# Patient Record
Sex: Male | Born: 1960 | Race: Black or African American | Hispanic: No | Marital: Single | Smoking: Current every day smoker
Health system: Southern US, Community
[De-identification: ages and names within clinical notes are randomized; demographics above are authoritative.]

## PROBLEM LIST (undated history)

## (undated) DIAGNOSIS — E119 Type 2 diabetes mellitus without complications: Secondary | ICD-10-CM

## (undated) DIAGNOSIS — I2510 Atherosclerotic heart disease of native coronary artery without angina pectoris: Secondary | ICD-10-CM

## (undated) DIAGNOSIS — I10 Essential (primary) hypertension: Secondary | ICD-10-CM

## (undated) DIAGNOSIS — I5022 Chronic systolic (congestive) heart failure: Secondary | ICD-10-CM

## (undated) HISTORY — PX: OTHER SURGICAL HISTORY: SHX169

---

## 2012-09-24 ENCOUNTER — Emergency Department (HOSPITAL_COMMUNITY)
Admission: EM | Admit: 2012-09-24 | Discharge: 2012-09-24 | Disposition: A | Discharge status: Home / Self Care | Payer: BC Managed Care – PPO | Attending: Emergency Medicine | Admitting: Emergency Medicine

## 2012-09-24 ENCOUNTER — Encounter (HOSPITAL_COMMUNITY): Payer: Self-pay | Admitting: Emergency Medicine

## 2012-09-24 DIAGNOSIS — F17200 Nicotine dependence, unspecified, uncomplicated: Secondary | ICD-10-CM | POA: Insufficient documentation

## 2012-09-24 DIAGNOSIS — M545 Low back pain, unspecified: Secondary | ICD-10-CM | POA: Insufficient documentation

## 2012-09-24 DIAGNOSIS — R0601 Orthopnea: Secondary | ICD-10-CM | POA: Insufficient documentation

## 2012-09-24 DIAGNOSIS — Z79899 Other long term (current) drug therapy: Secondary | ICD-10-CM | POA: Insufficient documentation

## 2012-09-24 DIAGNOSIS — I2510 Atherosclerotic heart disease of native coronary artery without angina pectoris: Secondary | ICD-10-CM | POA: Insufficient documentation

## 2012-09-24 DIAGNOSIS — R0602 Shortness of breath: Secondary | ICD-10-CM | POA: Insufficient documentation

## 2012-09-24 DIAGNOSIS — R350 Frequency of micturition: Secondary | ICD-10-CM

## 2012-09-24 DIAGNOSIS — I10 Essential (primary) hypertension: Secondary | ICD-10-CM | POA: Insufficient documentation

## 2012-09-24 DIAGNOSIS — Z9861 Coronary angioplasty status: Secondary | ICD-10-CM | POA: Insufficient documentation

## 2012-09-24 HISTORY — DX: Essential (primary) hypertension: I10

## 2012-09-24 HISTORY — DX: Atherosclerotic heart disease of native coronary artery without angina pectoris: I25.10

## 2012-09-24 LAB — URINALYSIS, ROUTINE W REFLEX MICROSCOPIC
Glucose, UA: NEGATIVE mg/dL
Hgb urine dipstick: NEGATIVE
Ketones, ur: NEGATIVE mg/dL
pH: 6 (ref 5.0–8.0)

## 2012-09-24 LAB — URINE MICROSCOPIC-ADD ON

## 2012-09-24 MED ORDER — CYCLOBENZAPRINE HCL 10 MG PO TABS: 10.0000 mg | ORAL_TABLET | Freq: Two times a day (BID) | ORAL | Status: DC | PRN

## 2012-09-24 MED ORDER — NAPROXEN 500 MG PO TABS: 500.0000 mg | ORAL_TABLET | Freq: Two times a day (BID) | ORAL | Status: DC

## 2012-09-24 NOTE — ED Notes (Signed)
PT. REPORTS URINARY FREQUENCY AND LOW BACK PAIN FOR SEVERAL DAYS , DENIES FALL OR INJURY , AMBULATORY , NO FEVER OR CHILLS . HYPERTENSIVE AT TRIAGE .

## 2012-09-24 NOTE — ED Provider Notes (Signed)
CSN: [id number]     Arrival date & time 09/24/12  1804 History    This chart was scribed for Felicie Morn, NP working with Lyanne Co, MD by Quintella Reichert, ED Scribe. This patient was seen in room TR11C/TR11C and the patient's care was started at 7:47 PM.     Chief Complaint  Patient presents with  . Urinary Frequency  . Back Pain    Patient is a 52 y.o. male presenting with frequency and back pain. The history is provided by the patient. No language interpreter was used.  Urinary Frequency This is a new problem. The current episode started more than 1 week ago. The problem occurs constantly. The problem has been gradually worsening. Pertinent negatives include no chest pain, no abdominal pain, no headaches and no shortness of breath. Nothing aggravates the symptoms. Nothing relieves the symptoms. He has tried nothing for the symptoms.  Back Pain Location:  Lumbar spine Radiates to:  Does not radiate Pain severity:  Moderate Duration:  1 week Timing:  Constant Progression:  Worsening Chronicity:  New Context: lifting heavy objects (does so regularly at work but denies any recent changes)   Context: not falling, not MVA and not recent injury   Relieved by:  Nothing Worsened by:  Twisting Ineffective treatments:  None tried Associated symptoms: no abdominal pain, no chest pain, no dysuria and no headaches     HPI Comments: Devon [name] is a 52 y.o. male with h/o CAD and HTN who presents to the Emergency Department complaining of 2 weeks of progressively-worsening lower back pain with accompanying urinary frequency.  Pt notes that he lifts regularly at work but denies any recent changes, unusual activities or injuries that may have brought on pain.  Pain is localized to both sides of the lower back and he denies midline back pain.  It is exacerbated by twisting his torso.  He states that he is urinating more frequently in small amounts and has been waking up at night to urinate.   He denies dysuria, hematuria or discharge.  He denies h/o DM to his knowledge and has not had his prostate checked.  Pt also complains of some positional SOB when he lies flat at night and states he has to sleep propped up with 2 pillows but still feels slightly SOB.  He denies SOB at other times and denies leg or ankle swelling.  He does not take any medications regularly.  He denies medication allergies.  Pt has no PCP   Past Medical History  Diagnosis Date  . Coronary artery disease   . Hypertension     Past Surgical History  Procedure Laterality Date  . Coronary stents      No family history on file.   History  Substance Use Topics  . Smoking status: Current Every Day Smoker  . Smokeless tobacco: Not on file  . Alcohol Use: Yes     Review of Systems  Respiratory: Negative for shortness of breath.   Cardiovascular: Negative for chest pain and leg swelling.       Orthopnea  Gastrointestinal: Negative for abdominal pain.  Genitourinary: Positive for frequency. Negative for dysuria, hematuria and discharge.  Musculoskeletal: Positive for back pain.  Neurological: Negative for headaches.  All other systems reviewed and are negative.      Allergies  Review of patient's allergies indicates no known allergies.  Home Medications   Current Outpatient Rx  Name  Route  Sig  Dispense  Refill  .  aspirin 325 MG tablet   Oral   Take 325 mg by mouth daily.         Marland Kitchen ibuprofen (ADVIL,MOTRIN) 200 MG tablet   Oral   Take 600 mg by mouth every 6 (six) hours as needed for pain.          Temp(Src) 99.2 F (37.3 C) (Oral)  Physical Exam  Nursing note and vitals reviewed. Constitutional: He is oriented to person, place, and time. He appears well-developed and well-nourished. No distress.  HENT:  Head: Normocephalic and atraumatic.  Eyes: EOM are normal.  Neck: Neck supple. No tracheal deviation present.  Cardiovascular: Normal rate, regular rhythm and normal heart  sounds.   No murmur heard. Pulmonary/Chest: Effort normal and breath sounds normal. No respiratory distress. He has no wheezes. He has no rales.  Musculoskeletal: Normal range of motion.       Lumbar back: He exhibits spasm.  Spasmodic pain to lumbar paraspinal muscles  Neurological: He is alert and oriented to person, place, and time.  Skin: Skin is warm and dry.  Psychiatric: He has a normal mood and affect. His behavior is normal.    ED Course  Procedures (including critical care time)   COORDINATION OF CARE: 7:54 PM-Discussed treatment plan which includes UA, CBG and f/u with PCP with pt at bedside and pt agreed to plan.     Results for orders placed during the hospital encounter of 09/24/12  URINALYSIS, ROUTINE W REFLEX MICROSCOPIC      Result Value Range   Color, Urine YELLOW  YELLOW   APPearance CLEAR  CLEAR   Specific Gravity, Urine 1.013  1.005 - 1.030   pH 6.0  5.0 - 8.0   Glucose, UA NEGATIVE  NEGATIVE mg/dL   Hgb urine dipstick NEGATIVE  NEGATIVE   Bilirubin Urine NEGATIVE  NEGATIVE   Ketones, ur NEGATIVE  NEGATIVE mg/dL   Protein, ur 119 (*) NEGATIVE mg/dL   Urobilinogen, UA 1.0  0.0 - 1.0 mg/dL   Nitrite NEGATIVE  NEGATIVE   Leukocytes, UA NEGATIVE  NEGATIVE  URINE MICROSCOPIC-ADD ON      Result Value Range   Squamous Epithelial / LPF RARE  RARE   WBC, UA 0-2  <3 WBC/hpf   RBC / HPF 0-2  <3 RBC/hpf  GLUCOSE, CAPILLARY      Result Value Range   Glucose-Capillary 107 (*) 70 - 99 mg/dL     No diagnosis found.   MDM  No indication of UTI.  No diabetic history, random blood glucose 7 hours post-prandial normal.  Suspect musculoskeletal back pain.  No worrisome neurologic findings (normal exam).      I personally performed the services described in this documentation, which was scribed in my presence. The recorded information has been reviewed and is accurate.   Jimmye Norman, NP 09/24/12 [phone]

## 2012-09-27 NOTE — ED Provider Notes (Signed)
Medical screening examination/treatment/procedure(s) were performed by non-physician practitioner and as supervising physician I was immediately available for consultation/collaboration.   Kyen Taite M Mouna Yager, MD 09/27/12 1428 

## 2012-10-26 ENCOUNTER — Encounter (HOSPITAL_COMMUNITY): Payer: Self-pay | Admitting: Emergency Medicine

## 2012-10-26 ENCOUNTER — Other Ambulatory Visit: Payer: Self-pay

## 2012-10-26 ENCOUNTER — Emergency Department (HOSPITAL_COMMUNITY)
Admission: EM | Admit: 2012-10-26 | Discharge: 2012-10-26 | Disposition: A | Discharge status: Home / Self Care | Payer: BC Managed Care – PPO | Attending: Emergency Medicine | Admitting: Emergency Medicine

## 2012-10-26 ENCOUNTER — Emergency Department (HOSPITAL_COMMUNITY): Payer: BC Managed Care – PPO

## 2012-10-26 DIAGNOSIS — Z79899 Other long term (current) drug therapy: Secondary | ICD-10-CM | POA: Insufficient documentation

## 2012-10-26 DIAGNOSIS — I10 Essential (primary) hypertension: Secondary | ICD-10-CM | POA: Insufficient documentation

## 2012-10-26 DIAGNOSIS — E119 Type 2 diabetes mellitus without complications: Secondary | ICD-10-CM | POA: Insufficient documentation

## 2012-10-26 DIAGNOSIS — J189 Pneumonia, unspecified organism: Secondary | ICD-10-CM

## 2012-10-26 DIAGNOSIS — J159 Unspecified bacterial pneumonia: Secondary | ICD-10-CM | POA: Insufficient documentation

## 2012-10-26 DIAGNOSIS — I2510 Atherosclerotic heart disease of native coronary artery without angina pectoris: Secondary | ICD-10-CM | POA: Insufficient documentation

## 2012-10-26 DIAGNOSIS — R5381 Other malaise: Secondary | ICD-10-CM | POA: Insufficient documentation

## 2012-10-26 DIAGNOSIS — F17200 Nicotine dependence, unspecified, uncomplicated: Secondary | ICD-10-CM | POA: Insufficient documentation

## 2012-10-26 DIAGNOSIS — Z9861 Coronary angioplasty status: Secondary | ICD-10-CM | POA: Insufficient documentation

## 2012-10-26 DIAGNOSIS — R110 Nausea: Secondary | ICD-10-CM | POA: Insufficient documentation

## 2012-10-26 DIAGNOSIS — I509 Heart failure, unspecified: Secondary | ICD-10-CM | POA: Insufficient documentation

## 2012-10-26 DIAGNOSIS — Z7982 Long term (current) use of aspirin: Secondary | ICD-10-CM | POA: Insufficient documentation

## 2012-10-26 HISTORY — DX: Type 2 diabetes mellitus without complications: E11.9

## 2012-10-26 LAB — CBC
HCT: 38.4 % — ABNORMAL LOW (ref 39.0–52.0)
MCH: 28.1 pg (ref 26.0–34.0)
MCV: 80.5 fL (ref 78.0–100.0)
Platelets: 254 10*3/uL (ref 150–400)
RBC: 4.77 MIL/uL (ref 4.22–5.81)

## 2012-10-26 LAB — BASIC METABOLIC PANEL
BUN: 18 mg/dL (ref 6–23)
CO2: 24 mEq/L (ref 19–32)
Calcium: 9.1 mg/dL (ref 8.4–10.5)
Creatinine, Ser: 1.34 mg/dL (ref 0.50–1.35)
Glucose, Bld: 199 mg/dL — ABNORMAL HIGH (ref 70–99)

## 2012-10-26 LAB — PRO B NATRIURETIC PEPTIDE: Pro B Natriuretic peptide (BNP): 6306 pg/mL — ABNORMAL HIGH (ref 0–125)

## 2012-10-26 LAB — TROPONIN I: Troponin I: <0.3 ng/mL (ref <0.30)

## 2012-10-26 MED ORDER — SODIUM CHLORIDE 0.9 % IV BOLUS (SEPSIS)
250.0000 mL | Freq: Once | INTRAVENOUS | Status: AC
  Administered 2012-10-26: 21:00:00 via INTRAVENOUS

## 2012-10-26 MED ORDER — ALBUTEROL SULFATE HFA 108 (90 BASE) MCG/ACT IN AERS
2.0000 | INHALATION_SPRAY | Freq: Four times a day (QID) | RESPIRATORY_TRACT | Status: DC
  Administered 2012-10-26: 2 via RESPIRATORY_TRACT
  Filled 2012-10-26: qty 6.7

## 2012-10-26 MED ORDER — DM-GUAIFENESIN ER 30-600 MG PO TB12: 1.0000 | ORAL_TABLET | Freq: Two times a day (BID) | ORAL | Status: DC

## 2012-10-26 MED ORDER — LEVOFLOXACIN 500 MG PO TABS: 750.0000 mg | ORAL_TABLET | Freq: Every day | ORAL | Status: DC

## 2012-10-26 MED ORDER — AZITHROMYCIN 250 MG PO TABS
500.0000 mg | ORAL_TABLET | Freq: Once | ORAL | Status: AC
  Administered 2012-10-26: 500 mg via ORAL
  Filled 2012-10-26: qty 2

## 2012-10-26 MED ORDER — DEXTROSE 5 % IV SOLN
1.0000 g | Freq: Once | INTRAVENOUS | Status: AC
  Administered 2012-10-26: 1 g via INTRAVENOUS
  Filled 2012-10-26: qty 10

## 2012-10-26 MED ORDER — SODIUM CHLORIDE 0.9 % IV SOLN
INTRAVENOUS | Status: DC
  Administered 2012-10-26: 100 mL/h via INTRAVENOUS

## 2012-10-26 NOTE — ED Notes (Signed)
Pt. reports progressing SOB / exertional dyspnea for several weeks with occasional productive cough , pt. stated history of CHF and cardiac stent . Denies chest pain .

## 2012-10-26 NOTE — ED Notes (Addendum)
Pt initially declined cxr, plan process and wait explained. Pt & wife encouraged. Pt to xray by w/c. Pt & wife have expectations based on what they were told at urgent care earlier. Pt alert, NAD, calm, frustrated.

## 2012-10-26 NOTE — ED Notes (Signed)
Pt reports shortness of breath started several weeks ago. Was at urgent care today and sent here for further evaluation. Pt has labored breathing but is maintaining 100 SpO2 on room air. Pt respiration rate is 28. Pt reports 10lb weight gain in the past month. Pt received breathing tx while at urgent care, but reports no improvement.

## 2012-10-26 NOTE — ED Notes (Addendum)
Pt is a pt of Dr. Littie Deeds.  Dr. Jacinto Halim on call & returned page. Not familiar with this pt. Triage protocols initiated & explained. Dr. Jacinto Halim stated, "Call if found to be cardiac". no further orders. Pending lab & imaging results.

## 2012-10-26 NOTE — ED Provider Notes (Signed)
He CSN: [id number]     Arrival date & time 10/26/12  1902 History   First MD Initiated Contact with Patient 10/26/12 2012     Chief Complaint  Patient presents with  . Shortness of Breath   (Consider location/radiation/quality/duration/timing/severity/associated sxs/prior Treatment) The history is provided by the patient and the spouse.   52 year old male past history of coronary artery disease currently not followed. Recent history of hypertension. And diabetes. Patient with productive cough for a week increasing shortness of breath today with exertion. Chest pain only with cough no constant pain in the chest. Denies fevers. Some nausea no vomiting no diarrhea. Patient seen in urgent care and there was concern for congestive heart failure no chest x-ray done there and referred here.  Past Medical History  Diagnosis Date  . Coronary artery disease   . Hypertension   . CHF (congestive heart failure)   . Diabetes mellitus without complication    Past Surgical History  Procedure Laterality Date  . Coronary stents     No family history on file. History  Substance Use Topics  . Smoking status: Current Every Day Smoker  . Smokeless tobacco: Not on file  . Alcohol Use: Yes    Review of Systems  Constitutional: Positive for fatigue. Negative for fever and chills.  HENT: Positive for congestion.   Eyes: Negative for visual disturbance.  Respiratory: Positive for cough and shortness of breath.   Cardiovascular: Positive for chest pain.  Gastrointestinal: Positive for nausea. Negative for vomiting and abdominal pain.  Genitourinary: Negative for dysuria.  Musculoskeletal: Negative for back pain.  Skin: Negative for rash.  Neurological: Negative for headaches.  Hematological: Does not bruise/bleed easily.  Psychiatric/Behavioral: Negative for confusion.    Allergies  Review of patient's allergies indicates no known allergies.  Home Medications   Current Outpatient Rx  Name   Route  Sig  Dispense  Refill  . aspirin EC 81 MG tablet   Oral   Take 81 mg by mouth daily.         . cyclobenzaprine (FLEXERIL) 10 MG tablet   Oral   Take 1 tablet (10 mg total) by mouth 2 (two) times daily as needed for muscle spasms.   20 tablet   0   . dextromethorphan-guaiFENesin (MUCINEX DM) 30-600 MG per 12 hr tablet   Oral   Take 1 tablet by mouth every 12 (twelve) hours.   14 tablet   0   . levofloxacin (LEVAQUIN) 500 MG tablet   Oral   Take 1.5 tablets (750 mg total) by mouth daily.   5 tablet   0   . naproxen (NAPROSYN) 500 MG tablet   Oral   Take 1 tablet (500 mg total) by mouth 2 (two) times daily.   20 tablet   0    BP 143/98  Pulse 91  Temp(Src) 98 F (36.7 C) (Oral)  Resp 22  Ht 5\' 10"  (1.778 m)  Wt 255 lb (115.667 kg)  BMI 36.59 kg/m2  SpO2 92% Physical Exam  Nursing note and vitals reviewed. Constitutional: He is oriented to person, place, and time. He appears well-developed and well-nourished. No distress.  HENT:  Head: Normocephalic and atraumatic.  Mouth/Throat: Oropharynx is clear and moist.  Eyes: Conjunctivae and EOM are normal. Pupils are equal, round, and reactive to light.  Neck: Normal range of motion.  Cardiovascular: Normal rate, regular rhythm and normal heart sounds.   No murmur heard. Pulmonary/Chest: Effort normal and breath sounds normal. No  respiratory distress. He has no wheezes. He has no rales.  Abdominal: Soft. Bowel sounds are normal. There is no tenderness.  Musculoskeletal: Normal range of motion.  Neurological: He is alert and oriented to person, place, and time. No cranial nerve deficit. He exhibits normal muscle tone. Coordination normal.  Skin: Skin is warm. No rash noted. No erythema.    ED Course  Procedures (including critical care time) Labs Review Labs Reviewed  CBC - Abnormal; Notable for the following:    HCT 38.4 (*)    RDW 16.6 (*)    All other components within normal limits  BASIC METABOLIC  PANEL - Abnormal; Notable for the following:    Glucose, Bld 199 (*)    GFR calc non Af Amer 59 (*)    GFR calc Af Amer 69 (*)    All other components within normal limits  PRO B NATRIURETIC PEPTIDE - Abnormal; Notable for the following:    Pro B Natriuretic peptide (BNP) 6306.0 (*)    All other components within normal limits  TROPONIN I  POCT I-STAT TROPONIN I   Results for orders placed during the hospital encounter of 10/26/12  CBC      Result Value Range   WBC 9.6  4.0 - 10.5 K/uL   RBC 4.77  4.22 - 5.81 MIL/uL   Hemoglobin 13.4  13.0 - 17.0 g/dL   HCT 14.7 (*) 82.9 - 56.2 %   MCV 80.5  78.0 - 100.0 fL   MCH 28.1  26.0 - 34.0 pg   MCHC 34.9  30.0 - 36.0 g/dL   RDW 13.0 (*) 86.5 - 78.4 %   Platelets 254  150 - 400 K/uL  BASIC METABOLIC PANEL      Result Value Range   Sodium 135  135 - 145 mEq/L   Potassium 3.9  3.5 - 5.1 mEq/L   Chloride 99  96 - 112 mEq/L   CO2 24  19 - 32 mEq/L   Glucose, Bld 199 (*) 70 - 99 mg/dL   BUN 18  6 - 23 mg/dL   Creatinine, Ser 6.96  0.50 - 1.35 mg/dL   Calcium 9.1  8.4 - 29.5 mg/dL   GFR calc non Af Amer 59 (*) >90 mL/min   GFR calc Af Amer 69 (*) >90 mL/min  PRO B NATRIURETIC PEPTIDE      Result Value Range   Pro B Natriuretic peptide (BNP) 6306.0 (*) 0 - 125 pg/mL  TROPONIN I      Result Value Range   Troponin I <0.30  <0.30 ng/mL  POCT I-STAT TROPONIN I      Result Value Range   Troponin i, poc 0.04  0.00 - 0.08 ng/mL   Comment 3                Date: 10/26/2012  Rate: 92  Rhythm: normal sinus rhythm  QRS Axis: normal  Intervals: normal  ST/T Wave abnormalities: nonspecific ST changes  Conduction Disutrbances:none  Narrative Interpretation:   Old EKG Reviewed: none available Possible anterior lateral infarct age undetermined.   Imaging Review Dg Chest 2 View  10/26/2012   CLINICAL DATA:  Abnormal chest sounds. Cough.  EXAM: CHEST  2 VIEW  COMPARISON:  No priors.  FINDINGS: Diffuse peribronchial cuffing and mild  interstitial prominence throughout the lungs bilaterally. No consolidative airspace disease. Possible trace bilateral pleural effusions. No evidence of pulmonary edema. Heart size is mildly enlarged. Mediastinal contours are otherwise unremarkable.  IMPRESSION: 1.  The appearance of the chest suggests either severe bronchitis or early multilobar bronchopneumonia. 2. Trace bilateral pleural effusions are suspected. 3. Mild cardiomegaly.   Electronically Signed   By: Trudie Reed M.D.   On: 10/26/2012 20:16    MDM   1. Community acquired pneumonia   2. Hypertension    Patient referred from urgent care for concern for congestive heart failure. Patient did not have any cardiac-like chest pain and soreness in the chest from coughing for about a week. Chest x-ray is consistent with sniffing bronchitis or early mold Tylenol or pneumonia. Patient nontoxic in the ED patient's room air sats always been above 90% we walked him as well here prior to discharge and his sats were in the upper 90s. Patient subjectively feel short of breath. Afebrile no significant leukocytosis no anemia no sniffing and the backside abnormalities. Patient recently noted to have a borderline diabetes early diabetes. Also noted to be hypertensive and was noted at her previous visit in urgent care there in the process of a evaluating him and will be started on hypertensive meds by them and followup. Patient's had coronary stents in the past but has not followed by cardiology here. As stated x-ray consistent more with a community acquired pneumonia no recent admissions treated with Rocephin and Zithromax by mouth cardiac workup was negative to include 2 troponins. EKG without acute changes.  Patient will be discharged home on Levaquin due to the comorbidities. Referral to cardiology provided to continue followup with urgent care. Patient knows to return if he gets worse at all or not improved in one to 2 days. Patient also given albuterol  inhaler and Mucinex DM to take at home.    Shelda Jakes, MD 10/26/12 2245

## 2012-10-26 NOTE — ED Notes (Signed)
Pt ambulated in hallways. Upon arising from bed SpO2 89/RA when walking SpO2 94-98%/RA. Pt needed to stop- sts feeling SOB and winded- SpO2 97%. Dr. Deretha Emory made aware.

## 2012-10-28 ENCOUNTER — Emergency Department (HOSPITAL_COMMUNITY): Payer: BC Managed Care – PPO

## 2012-10-28 ENCOUNTER — Inpatient Hospital Stay (HOSPITAL_COMMUNITY)
Admission: EM | Admit: 2012-10-28 | Discharge: 2012-11-05 | DRG: 550 | Disposition: A | Discharge status: Home / Self Care | Payer: BC Managed Care – PPO | Attending: Internal Medicine | Admitting: Internal Medicine

## 2012-10-28 ENCOUNTER — Encounter (HOSPITAL_COMMUNITY): Payer: Self-pay | Admitting: *Deleted

## 2012-10-28 DIAGNOSIS — I509 Heart failure, unspecified: Secondary | ICD-10-CM

## 2012-10-28 DIAGNOSIS — I420 Dilated cardiomyopathy: Secondary | ICD-10-CM

## 2012-10-28 DIAGNOSIS — Z9861 Coronary angioplasty status: Secondary | ICD-10-CM

## 2012-10-28 DIAGNOSIS — Y849 Medical procedure, unspecified as the cause of abnormal reaction of the patient, or of later complication, without mention of misadventure at the time of the procedure: Secondary | ICD-10-CM | POA: Diagnosis present

## 2012-10-28 DIAGNOSIS — R918 Other nonspecific abnormal finding of lung field: Secondary | ICD-10-CM | POA: Diagnosis present

## 2012-10-28 DIAGNOSIS — I252 Old myocardial infarction: Secondary | ICD-10-CM | POA: Diagnosis not present

## 2012-10-28 DIAGNOSIS — I2789 Other specified pulmonary heart diseases: Secondary | ICD-10-CM | POA: Diagnosis present

## 2012-10-28 DIAGNOSIS — J9600 Acute respiratory failure, unspecified whether with hypoxia or hypercapnia: Secondary | ICD-10-CM | POA: Diagnosis present

## 2012-10-28 DIAGNOSIS — J209 Acute bronchitis, unspecified: Secondary | ICD-10-CM | POA: Diagnosis present

## 2012-10-28 DIAGNOSIS — I10 Essential (primary) hypertension: Secondary | ICD-10-CM | POA: Diagnosis present

## 2012-10-28 DIAGNOSIS — I5023 Acute on chronic systolic (congestive) heart failure: Principal | ICD-10-CM

## 2012-10-28 DIAGNOSIS — K7689 Other specified diseases of liver: Secondary | ICD-10-CM | POA: Diagnosis present

## 2012-10-28 DIAGNOSIS — I214 Non-ST elevation (NSTEMI) myocardial infarction: Secondary | ICD-10-CM

## 2012-10-28 DIAGNOSIS — R05 Cough: Secondary | ICD-10-CM | POA: Diagnosis present

## 2012-10-28 DIAGNOSIS — E119 Type 2 diabetes mellitus without complications: Secondary | ICD-10-CM | POA: Diagnosis present

## 2012-10-28 DIAGNOSIS — F17200 Nicotine dependence, unspecified, uncomplicated: Secondary | ICD-10-CM | POA: Diagnosis present

## 2012-10-28 DIAGNOSIS — Z720 Tobacco use: Secondary | ICD-10-CM | POA: Diagnosis not present

## 2012-10-28 DIAGNOSIS — E039 Hypothyroidism, unspecified: Secondary | ICD-10-CM | POA: Diagnosis present

## 2012-10-28 DIAGNOSIS — R0902 Hypoxemia: Secondary | ICD-10-CM | POA: Diagnosis present

## 2012-10-28 DIAGNOSIS — I2510 Atherosclerotic heart disease of native coronary artery without angina pectoris: Secondary | ICD-10-CM

## 2012-10-28 DIAGNOSIS — R7989 Other specified abnormal findings of blood chemistry: Secondary | ICD-10-CM

## 2012-10-28 DIAGNOSIS — I5021 Acute systolic (congestive) heart failure: Secondary | ICD-10-CM

## 2012-10-28 DIAGNOSIS — I50810 Right heart failure, unspecified: Secondary | ICD-10-CM

## 2012-10-28 DIAGNOSIS — T82897A Other specified complication of cardiac prosthetic devices, implants and grafts, initial encounter: Secondary | ICD-10-CM | POA: Diagnosis present

## 2012-10-28 DIAGNOSIS — J180 Bronchopneumonia, unspecified organism: Secondary | ICD-10-CM | POA: Diagnosis present

## 2012-10-28 DIAGNOSIS — G4733 Obstructive sleep apnea (adult) (pediatric): Secondary | ICD-10-CM | POA: Diagnosis present

## 2012-10-28 DIAGNOSIS — R058 Other specified cough: Secondary | ICD-10-CM | POA: Diagnosis present

## 2012-10-28 DIAGNOSIS — D638 Anemia in other chronic diseases classified elsewhere: Secondary | ICD-10-CM | POA: Diagnosis present

## 2012-10-28 DIAGNOSIS — J40 Bronchitis, not specified as acute or chronic: Secondary | ICD-10-CM

## 2012-10-28 DIAGNOSIS — Z7982 Long term (current) use of aspirin: Secondary | ICD-10-CM

## 2012-10-28 DIAGNOSIS — Z6833 Body mass index (BMI) 33.0-33.9, adult: Secondary | ICD-10-CM

## 2012-10-28 DIAGNOSIS — I428 Other cardiomyopathies: Secondary | ICD-10-CM | POA: Diagnosis present

## 2012-10-28 DIAGNOSIS — F411 Generalized anxiety disorder: Secondary | ICD-10-CM | POA: Diagnosis not present

## 2012-10-28 DIAGNOSIS — E669 Obesity, unspecified: Secondary | ICD-10-CM | POA: Diagnosis present

## 2012-10-28 DIAGNOSIS — E038 Other specified hypothyroidism: Secondary | ICD-10-CM | POA: Diagnosis present

## 2012-10-28 DIAGNOSIS — N179 Acute kidney failure, unspecified: Secondary | ICD-10-CM | POA: Diagnosis not present

## 2012-10-28 DIAGNOSIS — R0602 Shortness of breath: Secondary | ICD-10-CM | POA: Diagnosis present

## 2012-10-28 LAB — LIPASE, BLOOD: Lipase: 37 U/L (ref 11–59)

## 2012-10-28 LAB — CBC
HCT: 37.8 % — ABNORMAL LOW (ref 39.0–52.0)
Hemoglobin: 13.6 g/dL (ref 13.0–17.0)
MCH: 28.9 pg (ref 26.0–34.0)
MCV: 80.3 fL (ref 78.0–100.0)
Platelets: 257 10*3/uL (ref 150–400)
RBC: 4.71 MIL/uL (ref 4.22–5.81)
WBC: 9.3 10*3/uL (ref 4.0–10.5)

## 2012-10-28 LAB — HEPATIC FUNCTION PANEL
ALT: 289 U/L — ABNORMAL HIGH (ref 0–53)
Alkaline Phosphatase: 89 U/L (ref 39–117)
Indirect Bilirubin: 2.4 mg/dL — ABNORMAL HIGH (ref 0.3–0.9)
Total Bilirubin: 3.2 mg/dL — ABNORMAL HIGH (ref 0.3–1.2)

## 2012-10-28 LAB — BASIC METABOLIC PANEL
BUN: 22 mg/dL (ref 6–23)
CO2: 23 mEq/L (ref 19–32)
Calcium: 8.8 mg/dL (ref 8.4–10.5)
Chloride: 95 mEq/L — ABNORMAL LOW (ref 96–112)
Creatinine, Ser: 1.14 mg/dL (ref 0.50–1.35)
Glucose, Bld: 151 mg/dL — ABNORMAL HIGH (ref 70–99)

## 2012-10-28 LAB — GAMMA GT: GGT: 141 U/L — ABNORMAL HIGH (ref 7–51)

## 2012-10-28 LAB — MAGNESIUM: Magnesium: 2.1 mg/dL (ref 1.5–2.5)

## 2012-10-28 LAB — D-DIMER, QUANTITATIVE: D-Dimer, Quant: 2.36 ug/mL-FEU — ABNORMAL HIGH (ref 0.00–0.48)

## 2012-10-28 LAB — POCT I-STAT TROPONIN I: Troponin i, poc: 0.03 ng/mL (ref 0.00–0.08)

## 2012-10-28 LAB — GLUCOSE, CAPILLARY: Glucose-Capillary: 98 mg/dL (ref 70–99)

## 2012-10-28 MED ORDER — FUROSEMIDE 10 MG/ML IJ SOLN
40.0000 mg | Freq: Once | INTRAMUSCULAR | Status: AC
  Administered 2012-10-28: 40 mg via INTRAVENOUS
  Filled 2012-10-28: qty 4

## 2012-10-28 MED ORDER — SODIUM CHLORIDE 0.9 % IJ SOLN
3.0000 mL | Freq: Two times a day (BID) | INTRAMUSCULAR | Status: DC
  Administered 2012-10-28 – 2012-11-02 (×10): 3 mL via INTRAVENOUS

## 2012-10-28 MED ORDER — DOXYCYCLINE HYCLATE 100 MG PO TABS
100.0000 mg | ORAL_TABLET | Freq: Two times a day (BID) | ORAL | Status: DC
  Administered 2012-10-29 – 2012-11-02 (×11): 100 mg via ORAL
  Filled 2012-10-28 (×14): qty 1

## 2012-10-28 MED ORDER — IPRATROPIUM BROMIDE 0.02 % IN SOLN
0.5000 mg | RESPIRATORY_TRACT | Status: DC | PRN
  Filled 2012-10-28: qty 2.5

## 2012-10-28 MED ORDER — ENOXAPARIN SODIUM 40 MG/0.4ML ~~LOC~~ SOLN
40.0000 mg | SUBCUTANEOUS | Status: DC
  Administered 2012-10-28 – 2012-11-02 (×6): 40 mg via SUBCUTANEOUS
  Filled 2012-10-28 (×7): qty 0.4

## 2012-10-28 MED ORDER — ALBUTEROL SULFATE (5 MG/ML) 0.5% IN NEBU
2.5000 mg | INHALATION_SOLUTION | RESPIRATORY_TRACT | Status: DC | PRN
  Administered 2012-10-29 (×2): 2.5 mg via RESPIRATORY_TRACT
  Filled 2012-10-28 (×3): qty 0.5

## 2012-10-28 MED ORDER — IOHEXOL 350 MG/ML SOLN
100.0000 mL | Freq: Once | INTRAVENOUS | Status: AC | PRN
  Administered 2012-10-28: 100 mL via INTRAVENOUS

## 2012-10-28 MED ORDER — HYDROCOD POLST-CHLORPHEN POLST 10-8 MG/5ML PO LQCR
5.0000 mL | Freq: Two times a day (BID) | ORAL | Status: DC | PRN
  Administered 2012-10-29 – 2012-11-02 (×7): 5 mL via ORAL
  Filled 2012-10-28 (×9): qty 5

## 2012-10-28 MED ORDER — LORAZEPAM 2 MG/ML IJ SOLN
1.0000 mg | Freq: Once | INTRAMUSCULAR | Status: AC
  Administered 2012-10-28: 1 mg via INTRAVENOUS
  Filled 2012-10-28: qty 1

## 2012-10-28 MED ORDER — LISINOPRIL 5 MG PO TABS
5.0000 mg | ORAL_TABLET | Freq: Every day | ORAL | Status: DC
  Administered 2012-10-29 – 2012-11-01 (×3): 5 mg via ORAL
  Filled 2012-10-28 (×5): qty 1

## 2012-10-28 MED ORDER — DIPHENHYDRAMINE HCL 25 MG PO CAPS
25.0000 mg | ORAL_CAPSULE | Freq: Every evening | ORAL | Status: DC | PRN
  Administered 2012-10-30: 25 mg via ORAL
  Filled 2012-10-28: qty 1

## 2012-10-28 MED ORDER — ASPIRIN EC 81 MG PO TBEC
81.0000 mg | DELAYED_RELEASE_TABLET | Freq: Every day | ORAL | Status: DC
  Administered 2012-10-28 – 2012-11-05 (×8): 81 mg via ORAL
  Filled 2012-10-28 (×9): qty 1

## 2012-10-28 MED ORDER — INSULIN ASPART 100 UNIT/ML ~~LOC~~ SOLN
0.0000 [IU] | Freq: Three times a day (TID) | SUBCUTANEOUS | Status: DC
  Administered 2012-10-29: 12:00:00 2 [IU] via SUBCUTANEOUS

## 2012-10-28 MED ORDER — DEXTROSE 5 % IV SOLN
1.0000 g | INTRAVENOUS | Status: DC
  Administered 2012-10-29: 1 g via INTRAVENOUS
  Filled 2012-10-28 (×2): qty 10

## 2012-10-28 MED ORDER — AZITHROMYCIN 500 MG PO TABS: 500.0000 mg | ORAL_TABLET | Freq: Every day | ORAL | Status: DC

## 2012-10-28 NOTE — H&P (Signed)
Date: 10/28/2012               Patient Name:  Devon [name] MRN: [id number]  DOB: May 08, 1960 Age / Sex: 52 y.o., male   PCP: No Pcp Per Patient         Medical Service: Internal Medicine Teaching Service         Attending Physician: Dr. Burns Spain, MD    First Contact: Dr. Darci Needle  Pager: [phone]  Second Contact: Dr. Sherrine Maples Pager: [phone]       After Hours (After 5p/  First Contact Pager: [phone]  weekends / holidays): Second Contact Pager: [phone]   Chief Complaint: shortness of breath   History of Present Illness:  Devon [name] is a 52 year old man with history of DM2 (diagnosed at urgent care this week), HTN (diagnosed at urgent care this week), CAD (per pt MI in [place] in 2004 with cath and stent x 2 placed at that time) who presents for shortness of breath and nonproductive cough x 3-4 weeks. He sought care at [facility] last Thursday 9/11 and then again on Monday 9/15 at which time he was diagnosed with pneumonia and sent to the ED.  Pt was started on Levaquin and Mucinex and reports good medication compliance, but his symptoms have not improved since that time so he returned to ED today.  He states that he has not been able to sleep due to severe cough, he also gets some left sided abdominal pain when he coughs hard.  No runny nose, sore throat, ear pain, n/v/d. No sick contacts, no recent travel, no recent hospitalizations He endorses orthopnea but no chest pain, palpitations, PND, lower extremity swelling.  Of note, pt quit smoking one week ago, prior to that he had smoked two packs per week for many years.     Acute abdomen series in ED showed "persistent diffuse interstitial pattern compatible with severe bronchitis or early bronchopneumonia," small bilateral pleural effusions, and normal appearance of abdomen.  CTA chest showed no evidence of pulmonary embolus but "degree of righy sided heart failure" as well as two nodular opacities in left lower lobe with  recommendation for repeat chest CT in 3-6 months.  BNP elevated at 4543 but decreased from 6306 on 9/15.  Troponin x1 negative.  Pt received dose of Lasix in ED given elevated BNP. He was satting 99% at rest but desatted to 80s-90% with ambulation thus was admitted for further evaluation.  He was placed on supplemental oxygen and went back into upper 90s.    Prior to this admission, pt did not take any medications other than occasional aspirin.  DM2 and HTN were diagnosed at urgent care on 9/15.  FH of HTN (father, brother, sister), DM2 (sister) but not CAD.   Meds: Current Facility-Administered Medications  Medication Dose Route Frequency Provider Last Rate Last Dose  . ipratropium (ATROVENT) nebulizer solution 0.5 mg  0.5 mg Nebulization Q4H PRN Annett Gula, MD       And  . albuterol (PROVENTIL) (5 MG/ML) 0.5% nebulizer solution 2.5 mg  2.5 mg Nebulization Q4H PRN Annett Gula, MD      . aspirin EC tablet 81 mg  81 mg Oral Daily Annett Gula, MD   81 mg at 10/28/12 2127  . cefTRIAXone (ROCEPHIN) 1 g in dextrose 5 % 50 mL IVPB  1 g Intravenous Q24H [facility] [place], [place]      . chlorpheniramine-HYDROcodone (TUSSIONEX) 10-8 MG/5ML suspension  5 mL  5 mL Oral Q12H PRN Annett Gula, MD      . diphenhydrAMINE (BENADRYL) capsule 25 mg  25 mg Oral QHS PRN Annett Gula, MD      . doxycycline (VIBRA-TABS) tablet 100 mg  100 mg Oral Q12H Annett Gula, MD      . enoxaparin (LOVENOX) injection 40 mg  40 mg Subcutaneous Q24H Annett Gula, MD   40 mg at 10/28/12 2128  . [START ON 10/29/2012] insulin aspart (novoLOG) injection 0-15 Units  0-15 Units Subcutaneous TID WC Annett Gula, MD      . Melene Muller ON 10/29/2012] lisinopril (PRINIVIL,ZESTRIL) tablet 5 mg  5 mg Oral Daily Annett Gula, MD      . sodium chloride 0.9 % injection 3 mL  3 mL Intravenous Q12H Annett Gula, MD   3 mL at 10/28/12 2131  Outpatient medications- ASA, Mucinex, Levoquin    Allergies: Allergies as of 10/28/2012    . (No Known Allergies)   Past Medical History  Diagnosis Date  . Coronary artery disease   . Hypertension   . CHF (congestive heart failure)   . Diabetes mellitus without complication    Past Surgical History  Procedure Laterality Date  . Coronary stents     History reviewed. No pertinent family history. History   Social History  . Marital Status: Single    Spouse Name: N/A    Number of Children: N/A  . Years of Education: N/A   Occupational History  . Not on file.   Social History Main Topics  . Smoking status: Current Every Day Smoker  . Smokeless tobacco: Not on file  . Alcohol Use: Yes  . Drug Use: No  . Sexual Activity: Not on file   Other Topics Concern  . Not on file   Social History Narrative  . No narrative on file    Review of Systems: Review of Systems  Constitutional: Negative for fever, chills and weight loss.  Eyes: Negative for blurred vision.  Respiratory: Positive for cough and shortness of breath. Negative for hemoptysis, sputum production and wheezing.   Cardiovascular: Positive for orthopnea. Negative for chest pain, palpitations, leg swelling and PND.  Gastrointestinal: Positive for abdominal pain. Negative for nausea, vomiting, diarrhea, constipation, blood in stool and melena.  Genitourinary: Negative for dysuria.  Musculoskeletal: Negative for falls.  Neurological: Negative for dizziness, loss of consciousness and headaches.    Physical Exam:  Blood pressure 155/109, pulse 90, temperature 97.4 F (36.3 C), temperature source Oral, resp. rate 20, height 5\' 10"  (1.778 m), weight 258 lb 3.2 oz (117.119 kg), SpO2 100.00%. General: alert, cooperative, and in no apparent distress HEENT: NCAT, vision grossly intact, oropharynx clear and non-erythematous  Neck: supple, no lymphadenopathy Lungs: clear to ascultation bilaterally, normal work of respiration, no wheezes, rales, ronchi Heart: regular rate and rhythm, no murmurs, gallops, or  rubs Abdomen: mildly tender to palpation in left lateral abdomen, soft, non-distended, normal bowel sounds Extremities: 2+ DP pulses bilaterally, no cyanosis, clubbing, or edema Neurologic: alert & oriented X3, cranial nerves II-XII intact, strength grossly intact, sensation intact to light touch  Lab results: Basic Metabolic Panel:  Recent Labs  16/10/96 1925 10/28/12 1131 10/28/12 1225  NA 135 131*  --   K 3.9 4.9  --   CL 99 95*  --   CO2 24 23  --   GLUCOSE 199* 151*  --   BUN 18 22  --  CREATININE 1.34 1.14  --   CALCIUM 9.1 8.8  --   MG  --   --  2.1  PHOS  --   --  4.5   CBC:  Recent Labs  10/26/12 1925 10/28/12 1131  WBC 9.6 9.3  HGB 13.4 13.6  HCT 38.4* 37.8*  MCV 80.5 80.3  PLT 254 257   Cardiac Enzymes:  Recent Labs  10/26/12 2053  TROPONINI <0.30   BNP:  Recent Labs  10/26/12 1927 10/28/12 1225  PROBNP 6306.0* 4543.0*   D-Dimer:  Recent Labs  10/28/12 1225  DDIMER 2.36*   Imaging results:  Ct Angio Chest Pe W/cm &/or Wo Cm  10/28/2012   CLINICAL DATA:  Shortness of Breath  EXAM: CT ANGIOGRAPHY CHEST WITH CONTRAST  TECHNIQUE: Multidetector CT imaging of the chest was performed using the standard protocol during bolus administration of intravenous contrast. Multiplanar CT image reconstructions including MIPs were obtained to evaluate the vascular anatomy.  CONTRAST:  OMNIPAQUE IOHEXOL 350 MG/ML SOLN  COMPARISON:  Chest radiograph October 28, 2012  FINDINGS: There is no demonstrable pulmonary embolus. There is no thoracic aortic aneurysm.  Prominence of the hepatic veins and inferior vena cava raise question of a degree of right heart failure.  On axial slice 63, there is an 8 by 7 mm nodular opacity abutting the pleura in the lateral segment of the left lower lobe. On this same slice, there is a 5 x 5 mm nodular opacity in the periphery of the posterior segment of the right lower lobe. The interstitium is diffusely prominent with a  suspected mild degree of interstitial edema. There is no airspace consolidation. There is a small right pleural effusion.  There are several lymph nodes adjacent to the aortic arch and proximal descending thoracic aorta extending to the aortopulmonary window. The largest of these lymph nodes measures 2.2 by 1.6 cm. There is a lymph node just anterior to the Carina on the right measuring 2.0 by 1.5 cm. Several smaller lymph nodes are noted in the hila and mediastinum. There is a right hilar lymph node measuring 1.5 x 1.4 cm.  Heart is mildly enlarged. There is a minimal pericardial effusion.  The visualized upper abdominal structures appear normal except for prominence of the visualized inferior vena cava and hepatic veins.  There are no blastic or lytic bone lesions. Thyroid appears normal.  Review of the MIP images confirms the above findings.  IMPRESSION: No demonstrable pulmonary embolus.  Suspect a degree of congestive heart failure. There is felt to be a degree of right sided heart failure as at least part of the congestive heart failure pattern.  Two nodular opacities in the left lower lobe. Followup of these nodular lesion should be based on Fleischner society guidelines.  Mild adenopathy of uncertain etiology.  Minimal pericardial effusion.  If the patient is at high risk for bronchogenic carcinoma, follow-up chest CT at 3-82months is recommended. If the patient is at low risk for bronchogenic carcinoma, follow-up chest CT at 6-12 months is recommended. This recommendation follows the consensus statement: Guidelines for Management of Small Pulmonary Nodules Detected on CT Scans: A Statement from the Fleischner Society as published in Radiology 2005; 237:395-400.   Electronically Signed   By: Bretta Bang   On: 10/28/2012 15:16   Dg Abd Acute W/chest  10/28/2012   CLINICAL DATA:  Upper abdominal pain. Cough. Bronchitis. And subsequent encounter.  EXAM: ACUTE ABDOMEN SERIES (ABDOMEN 2 VIEW & CHEST 1  VIEW)  COMPARISON:  Two-view chest 10/26/2012.  FINDINGS: The heart is mildly enlarged. Extensive interstitial coarsening is again noted. No focal airspace consolidation is evident. Small bilateral pleural effusions are more evident.  Supine and upright views the abdomen demonstrate a nonspecific bowel gas pattern. There is no evidence for obstruction or free air.  IMPRESSION: 1. Persistent diffuse interstitial pattern compatible with severe bronchitis or early bronchopneumonia. 2. Small bilateral pleural effusions. 3. Normal radiographic appearance of the abdomen.   Electronically Signed   By: Gennette Pac   On: 10/28/2012 12:46    Other results: EKG: sinus rhythm, mild right axis deviation, QT prolongation, inverted T waves in V5-6, no ST elevation, no LVH  Assessment & Plan by Problem: #Shortness of breath- Bronchitis vs. PNA vs. CHF, likely multifactorial; 9/17 CXR showed "persistent diffuse interstitial pattern compatible with severe bronchitis or early bronchopenumonia." Pt afebrile, VSS, no leucocytosis; he was not on oxygen during interview or exam and did not feel short of breath.  Pt does not have known history of CHF though it is on his problem list.  Of note, proBNP elevated at 4543 but decreased from 6306 on 9/15, and pt does endorse some orthopnea.  On exam, pt does not appear volume overloaded, no crackles on lung exam, no lower extremity edema.  Therefore, will not continue to diurese but will obtain echo, monitor daily weights and Is and Os.  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  Mg, Phos within normal limits. -telemetry bed -doxycycline 100mg  PO BID x 5-7 days, Rocephin IV per pharmacy   -duonebs q4h prn shortness of breath as pt previously got some relief with nebs -echo -pulse ox -incentive  spirometry -repeat CT chest in 3-6 months per rads recs due to pulmonary nodules -tussionex prn cough -CMP in AM -request records from [facility] -consider PFTs as outpatient   #HTN- mostly 150s-160s/100s-120s. Pt not on any home antihypertensives, HTN just diagnosed at urgent care this week.  -start lisinopril 5 mg daily  #DM2- diagnosed at urgent care this week -HgbA1C pending -heart healthy diet  -CBG monitoring -SSI   #Elevated transaminases- AST 428, ALT 289, GGT 141; likely due to EtOH abuse, especially given close to 2:1 AST to ALT ratio and elevated GGT.  Pt states he drinks only on weekends, will drink 6 pack of beer by himself, last drink on ?Saturday 9/13.  -held off on CIWA protocol for now due to low concern for withdrawal -hepatitis panel, HIV antibody, PT in AM   #Musculoskeletal abdominal pain- in left lateral abdomen with mild TTP, likely due to coughing. Lipase 37. Consider starting NSAID if pain persists though pt's fiance believes he has side effects from Naproxen so could try ibuprofen instead.   #CAD- no concern for ACS given no chest pain, no ST changes on EKG, troponin x 1 negative -consider obtaining records from hospital in [place]  #Tobacco abuse- quit smoking 1 week ago, no nicotine patch needed/desired  #DVT ppx- lovenox, SCDs  #Code status- Full code  Dispo: Disposition is deferred at this time, awaiting improvement of current medical problems. Anticipated discharge in approximately 1-2 day(s).   The patient does not have a current PCP (No Pcp Per Patient) and does need an [facility] hospital follow-up appointment after discharge.   Signed: Rocco Serene, MD 10/28/2012, 11:30 PM

## 2012-10-28 NOTE — ED Notes (Signed)
Pt's  O2 sat was 99% at rest in the room but dropped to 90% on ambulation in the Hallway.RN Felipa Eth notified.

## 2012-10-28 NOTE — ED Provider Notes (Signed)
CSN: [id number]     Arrival date & time 10/28/12  1103 History   First MD Initiated Contact with Patient 10/28/12 1139     Chief Complaint  Patient presents with  . Shortness of Breath   (Consider location/radiation/quality/duration/timing/severity/associated sxs/prior Treatment) HPI Comments: Patient presents with cough and shortness of breath. He was seen here 2 days ago and was diagnosed with pneumonia. He was started on Levaquin. He has not noted any improvement since then. He complains of a severe cough which keeps him awake at night. He does have shortness of breath this is unchanged since she was here 2 days ago. He denies any chest pain or tightness. He denies any pedal edema. He has a history of coronary artery disease status post stent placement. He denies a known history of CHF although I do see it listed as one of his past diagnoses. He's been using Mucinex DM and states he has been taking his Levaquin. He does not note any improvement in symptoms over the last 2 days. He does state his shortness of breath and cough has been worsening over the last 3-4 weeks. He denies any pleuritic-type pain. He does note some swelling of his abdomen with distention. He has some discomfort to his left upper abdomen and under his left rib cage.  Patient is a 52 y.o. male presenting with shortness of breath.  Shortness of Breath Associated symptoms: abdominal pain and cough   Associated symptoms: no chest pain, no diaphoresis, no fever, no headaches, no rash and no vomiting     Past Medical History  Diagnosis Date  . Coronary artery disease   . Hypertension   . CHF (congestive heart failure)   . Diabetes mellitus without complication    Past Surgical History  Procedure Laterality Date  . Coronary stents     History reviewed. No pertinent family history. History  Substance Use Topics  . Smoking status: Current Every Day Smoker  . Smokeless tobacco: Not on file  . Alcohol Use: Yes     Review of Systems  Constitutional: Positive for fatigue. Negative for fever, chills and diaphoresis.  HENT: Negative for congestion, rhinorrhea and sneezing.   Eyes: Negative.   Respiratory: Positive for cough and shortness of breath. Negative for chest tightness.   Cardiovascular: Negative for chest pain and leg swelling.  Gastrointestinal: Positive for abdominal pain and abdominal distention. Negative for nausea, vomiting, diarrhea, constipation and blood in stool.  Genitourinary: Negative for frequency, hematuria, flank pain and difficulty urinating.  Musculoskeletal: Negative for back pain and arthralgias.  Skin: Negative for rash.  Neurological: Negative for dizziness, speech difficulty, weakness, numbness and headaches.    Allergies  Review of patient's allergies indicates no known allergies.  Home Medications   Current Outpatient Rx  Name  Route  Sig  Dispense  Refill  . aspirin EC 81 MG tablet   Oral   Take 81 mg by mouth daily.         Marland Kitchen dextromethorphan-guaiFENesin (MUCINEX DM) 30-600 MG per 12 hr tablet   Oral   Take 1 tablet by mouth every 12 (twelve) hours.   14 tablet   0   . levofloxacin (LEVAQUIN) 500 MG tablet   Oral   Take 1.5 tablets (750 mg total) by mouth daily.   5 tablet   0    BP 140/99  Pulse 85  Temp(Src) 99.2 F (37.3 C) (Rectal)  Resp 20  Wt 255 lb (115.667 kg)  BMI 36.59 kg/m2  SpO2 94% Physical Exam  Constitutional: He is oriented to person, place, and time. He appears well-developed and well-nourished.  HENT:  Head: Normocephalic and atraumatic.  Eyes: Pupils are equal, round, and reactive to light.  Neck: Normal range of motion. Neck supple.  Cardiovascular: Normal rate, regular rhythm and normal heart sounds.   Pulmonary/Chest: Effort normal and breath sounds normal. No respiratory distress. He has no wheezes. He has no rales. He exhibits no tenderness.  Abdominal: Soft. Bowel sounds are normal. There is tenderness (mild  tenderness to the left upper abdomen. His abdomen is distended but there is no tympany to percussion. No fluid wave is noted.). There is no rebound and no guarding.  Musculoskeletal: Normal range of motion. He exhibits no edema.  No calf tenderness  Lymphadenopathy:    He has no cervical adenopathy.  Neurological: He is alert and oriented to person, place, and time.  Skin: Skin is warm and dry. No rash noted.  Psychiatric: He has a normal mood and affect.    ED Course  Procedures (including critical care time) Labs Review Labs Reviewed  CBC - Abnormal; Notable for the following:    HCT 37.8 (*)    RDW 16.2 (*)    All other components within normal limits  BASIC METABOLIC PANEL - Abnormal; Notable for the following:    Sodium 131 (*)    Chloride 95 (*)    Glucose, Bld 151 (*)    GFR calc non Af Amer 72 (*)    GFR calc Af Amer 84 (*)    All other components within normal limits  PRO B NATRIURETIC PEPTIDE - Abnormal; Notable for the following:    Pro B Natriuretic peptide (BNP) 4543.0 (*)    All other components within normal limits  D-DIMER, QUANTITATIVE - Abnormal; Notable for the following:    D-Dimer, Quant 2.36 (*)    All other components within normal limits  POCT I-STAT TROPONIN I   Imaging Review Dg Chest 2 View  10/26/2012   CLINICAL DATA:  Abnormal chest sounds. Cough.  EXAM: CHEST  2 VIEW  COMPARISON:  No priors.  FINDINGS: Diffuse peribronchial cuffing and mild interstitial prominence throughout the lungs bilaterally. No consolidative airspace disease. Possible trace bilateral pleural effusions. No evidence of pulmonary edema. Heart size is mildly enlarged. Mediastinal contours are otherwise unremarkable.  IMPRESSION: 1. The appearance of the chest suggests either severe bronchitis or early multilobar bronchopneumonia. 2. Trace bilateral pleural effusions are suspected. 3. Mild cardiomegaly.   Electronically Signed   By: Trudie Reed M.D.   On: 10/26/2012 20:16   Ct  Angio Chest Pe W/cm &/or Wo Cm  10/28/2012   CLINICAL DATA:  Shortness of Breath  EXAM: CT ANGIOGRAPHY CHEST WITH CONTRAST  TECHNIQUE: Multidetector CT imaging of the chest was performed using the standard protocol during bolus administration of intravenous contrast. Multiplanar CT image reconstructions including MIPs were obtained to evaluate the vascular anatomy.  CONTRAST:  OMNIPAQUE IOHEXOL 350 MG/ML SOLN  COMPARISON:  Chest radiograph October 28, 2012  FINDINGS: There is no demonstrable pulmonary embolus. There is no thoracic aortic aneurysm.  Prominence of the hepatic veins and inferior vena cava raise question of a degree of right heart failure.  On axial slice 63, there is an 8 by 7 mm nodular opacity abutting the pleura in the lateral segment of the left lower lobe. On this same slice, there is a 5 x 5 mm nodular opacity in the periphery of the posterior  segment of the right lower lobe. The interstitium is diffusely prominent with a suspected mild degree of interstitial edema. There is no airspace consolidation. There is a small right pleural effusion.  There are several lymph nodes adjacent to the aortic arch and proximal descending thoracic aorta extending to the aortopulmonary window. The largest of these lymph nodes measures 2.2 by 1.6 cm. There is a lymph node just anterior to the Carina on the right measuring 2.0 by 1.5 cm. Several smaller lymph nodes are noted in the hila and mediastinum. There is a right hilar lymph node measuring 1.5 x 1.4 cm.  Heart is mildly enlarged. There is a minimal pericardial effusion.  The visualized upper abdominal structures appear normal except for prominence of the visualized inferior vena cava and hepatic veins.  There are no blastic or lytic bone lesions. Thyroid appears normal.  Review of the MIP images confirms the above findings.  IMPRESSION: No demonstrable pulmonary embolus.  Suspect a degree of congestive heart failure. There is felt to be a degree of  right sided heart failure as at least part of the congestive heart failure pattern.  Two nodular opacities in the left lower lobe. Followup of these nodular lesion should be based on Fleischner society guidelines.  Mild adenopathy of uncertain etiology.  Minimal pericardial effusion.  If the patient is at high risk for bronchogenic carcinoma, follow-up chest CT at 3-22months is recommended. If the patient is at low risk for bronchogenic carcinoma, follow-up chest CT at 6-12 months is recommended. This recommendation follows the consensus statement: Guidelines for Management of Small Pulmonary Nodules Detected on CT Scans: A Statement from the Fleischner Society as published in Radiology 2005; 237:395-400.   Electronically Signed   By: Bretta Bang   On: 10/28/2012 15:16   Dg Abd Acute W/chest  10/28/2012   CLINICAL DATA:  Upper abdominal pain. Cough. Bronchitis. And subsequent encounter.  EXAM: ACUTE ABDOMEN SERIES (ABDOMEN 2 VIEW & CHEST 1 VIEW)  COMPARISON:  Two-view chest 10/26/2012.  FINDINGS: The heart is mildly enlarged. Extensive interstitial coarsening is again noted. No focal airspace consolidation is evident. Small bilateral pleural effusions are more evident.  Supine and upright views the abdomen demonstrate a nonspecific bowel gas pattern. There is no evidence for obstruction or free air.  IMPRESSION: 1. Persistent diffuse interstitial pattern compatible with severe bronchitis or early bronchopneumonia. 2. Small bilateral pleural effusions. 3. Normal radiographic appearance of the abdomen.   Electronically Signed   By: Gennette Pac   On: 10/28/2012 12:46    Date: 10/28/2012  Rate: 84  Rhythm: normal sinus rhythm  QRS Axis: normal  Intervals: normal  ST/T Wave abnormalities: nonspecific ST/T changes  Conduction Disutrbances:none  Narrative Interpretation:   Old EKG Reviewed: unchanged   MDM   1. CHF (congestive heart failure)   2. Bronchitis   3. Hypoxia    Patient presents  with increasing shortness of breath the tissue a cough and some orthopnea. He's been treated with Levaquin for pneumonia and has not had any improvement in symptoms. His chest x-ray is not significantly changed from 2 days ago. He has some diffuse interstitial disease but no lobar pneumonia. A CT scan does not demonstrate any pulmonary embolus. He did not have any ischemic changes on EKG. His troponin is negative. He was given a dose of Lasix here given his elevated BNP and other suggestive signs of CHF. He was given a diuresis a large amount of fluid here however when we ambulate him  he still drops his oxygen saturations into the 80s. He dropped to 89% and then when he sat back in the bed it went down into the mid and low 80s. He was placed on supplemental oxygen and pop right back up into the upper 90s. However given his ongoing hypoxia despite diuresis he felt he needed to be admitted for further evaluation. I consulted internal medicine teaching service who is on call for unassigned admissions and they will admit the patient.    Rolan Bucco, MD 10/28/12 [phone]

## 2012-10-28 NOTE — Progress Notes (Signed)
Pt received from ED.  Received lasix in ED - Pt given urinal and instructions given for him to save all urine for measurement.  No c/o pain on admit.  Skin warm and dry.

## 2012-10-28 NOTE — ED Notes (Signed)
Pt in stating he was recently dx with pneumonia, states he was seen for this on Monday and discharged on antibiotics, states he has been compliant with these medications, in today due to continued pain and coughing, pt states he has not been able to sleep due to coughing and pain

## 2012-10-28 NOTE — Progress Notes (Signed)
ANTIBIOTIC CONSULT NOTE - INITIAL  Pharmacy Consult for ceftriaxone Indication: early bronchopneumonia  No Known Allergies  Patient Measurements: Height: 5\' 10"  (177.8 cm) Weight: 258 lb 3.2 oz (117.119 kg) (scale b) IBW/kg (Calculated) : 73  Vital Signs: Temp: 97.4 F (36.3 C) (09/17 1925) Temp src: Oral (09/17 1925) BP: 155/109 mmHg (09/17 1925) Pulse Rate: 90 (09/17 1925) Intake/Output from previous day:   Intake/Output from this shift: Total I/O In: -  Out: 200 [Urine:200]  Labs:  Recent Labs  10/26/12 1925 10/28/12 1131  WBC 9.6 9.3  HGB 13.4 13.6  PLT 254 257  CREATININE 1.34 1.14   Estimated Creatinine Clearance: 97.1 ml/min (by C-G formula based on Cr of 1.14). No results found for this basename: VANCOTROUGH, VANCOPEAK, VANCORANDOM, GENTTROUGH, GENTPEAK, GENTRANDOM, TOBRATROUGH, TOBRAPEAK, TOBRARND, AMIKACINPEAK, AMIKACINTROU, AMIKACIN,  in the last 72 hours   Microbiology: No results found for this or any previous visit (from the past 720 hour(s)).  Medical History: Past Medical History  Diagnosis Date  . Coronary artery disease   . Hypertension   . CHF (congestive heart failure)   . Diabetes mellitus without complication     Medications:  Prescriptions prior to admission  Medication Sig Dispense Refill  . aspirin EC 81 MG tablet Take 81 mg by mouth daily.      Marland Kitchen dextromethorphan-guaiFENesin (MUCINEX DM) 30-600 MG per 12 hr tablet Take 1 tablet by mouth every 12 (twelve) hours.  14 tablet  0  . levofloxacin (LEVAQUIN) 500 MG tablet Take 1.5 tablets (750 mg total) by mouth daily.  5 tablet  0   Assessment: 52 yo male who presents to the ED tonight with SOB. He was in the ED 2 days ago with similar complaints and was discharged on Levaquin for pneumonia and told to return if no improvement. CXR shows severe bronchitis or early bronchopneumonia. Tmax is 99.2 and WBC are wnl.  Pharmacy consulted to begin ceftriaxone. There is no need to adjust dose  for renal function.  Goal of Therapy:  Eradication of pneumonia  Plan:  -Ceftriaxone 1 g IV q24h -Pharmacy signing off - please re-consult if needed  [place], [place].D., BCPS Clinical Pharmacist Pager: [phone] 10/28/2012 10:41 PM

## 2012-10-29 ENCOUNTER — Encounter (HOSPITAL_COMMUNITY): Payer: Self-pay | Admitting: Internal Medicine

## 2012-10-29 DIAGNOSIS — F17200 Nicotine dependence, unspecified, uncomplicated: Secondary | ICD-10-CM

## 2012-10-29 DIAGNOSIS — R05 Cough: Secondary | ICD-10-CM | POA: Diagnosis present

## 2012-10-29 DIAGNOSIS — R0602 Shortness of breath: Secondary | ICD-10-CM | POA: Diagnosis present

## 2012-10-29 DIAGNOSIS — E119 Type 2 diabetes mellitus without complications: Secondary | ICD-10-CM | POA: Diagnosis present

## 2012-10-29 DIAGNOSIS — I10 Essential (primary) hypertension: Secondary | ICD-10-CM | POA: Diagnosis present

## 2012-10-29 DIAGNOSIS — Z720 Tobacco use: Secondary | ICD-10-CM | POA: Diagnosis not present

## 2012-10-29 DIAGNOSIS — R0902 Hypoxemia: Secondary | ICD-10-CM | POA: Diagnosis present

## 2012-10-29 DIAGNOSIS — R918 Other nonspecific abnormal finding of lung field: Secondary | ICD-10-CM | POA: Diagnosis present

## 2012-10-29 DIAGNOSIS — J180 Bronchopneumonia, unspecified organism: Secondary | ICD-10-CM | POA: Diagnosis present

## 2012-10-29 DIAGNOSIS — J209 Acute bronchitis, unspecified: Secondary | ICD-10-CM | POA: Diagnosis present

## 2012-10-29 LAB — GLUCOSE, CAPILLARY: Glucose-Capillary: 122 mg/dL — ABNORMAL HIGH (ref 70–99)

## 2012-10-29 LAB — HIV ANTIBODY (ROUTINE TESTING W REFLEX): HIV: NONREACTIVE

## 2012-10-29 LAB — LIPID PANEL
HDL: 15 mg/dL — ABNORMAL LOW (ref >39)
LDL Cholesterol: 105 mg/dL — ABNORMAL HIGH (ref 0–99)
Total CHOL/HDL Ratio: 9.1 RATIO
Triglycerides: 79 mg/dL (ref <150)
VLDL: 16 mg/dL (ref 0–40)

## 2012-10-29 LAB — COMPREHENSIVE METABOLIC PANEL
ALT: 316 U/L — ABNORMAL HIGH (ref 0–53)
AST: 322 U/L — ABNORMAL HIGH (ref 0–37)
Albumin: 3.2 g/dL — ABNORMAL LOW (ref 3.5–5.2)
Alkaline Phosphatase: 94 U/L (ref 39–117)
BUN: 23 mg/dL (ref 6–23)
CO2: 25 mEq/L (ref 19–32)
Chloride: 97 mEq/L (ref 96–112)
Creatinine, Ser: 1.13 mg/dL (ref 0.50–1.35)
GFR calc non Af Amer: 73 mL/min — ABNORMAL LOW (ref >90)
Potassium: 4.2 mEq/L (ref 3.5–5.1)
Total Bilirubin: 1.9 mg/dL — ABNORMAL HIGH (ref 0.3–1.2)

## 2012-10-29 LAB — HEPATITIS PANEL, ACUTE
HCV Ab: NEGATIVE
Hep A IgM: NEGATIVE
Hep B C IgM: NEGATIVE

## 2012-10-29 LAB — HEMOGLOBIN A1C: Mean Plasma Glucose: 131 mg/dL — ABNORMAL HIGH (ref <117)

## 2012-10-29 LAB — PROTIME-INR: INR: 1.59 — ABNORMAL HIGH (ref 0.00–1.49)

## 2012-10-29 MED ORDER — CARVEDILOL 6.25 MG PO TABS
6.2500 mg | ORAL_TABLET | Freq: Two times a day (BID) | ORAL | Status: DC
  Administered 2012-10-29 – 2012-11-01 (×6): 6.25 mg via ORAL
  Filled 2012-10-29 (×8): qty 1

## 2012-10-29 MED ORDER — ATORVASTATIN CALCIUM 20 MG PO TABS
20.0000 mg | ORAL_TABLET | Freq: Every day | ORAL | Status: DC
  Administered 2012-10-29 – 2012-11-04 (×7): 20 mg via ORAL
  Filled 2012-10-29 (×8): qty 1

## 2012-10-29 MED ORDER — AMOXICILLIN 500 MG PO CAPS
1000.0000 mg | ORAL_CAPSULE | Freq: Three times a day (TID) | ORAL | Status: DC
  Administered 2012-10-29 – 2012-11-03 (×15): 1000 mg via ORAL
  Filled 2012-10-29 (×18): qty 2

## 2012-10-29 NOTE — Progress Notes (Signed)
Visit to patient while in hospital. Will call after patient is discharged to assist with transition of care. 

## 2012-10-29 NOTE — Progress Notes (Addendum)
Subjective: Patient denies SOB and reports that his cough has much improved. He slept well overnight. He received duoneb last night, 40mg  lasix. Patient does report snoring at night and that his girlfriend has noted he stops breathing at times at night- has not had a sleep study. Denies previous diagnosis of DM, HTN, CHF. I took off O2 while I was talking with the patient and nurse rechecked pulse ox in 20 minutes both at rest and with ambulation, pt maintained 95-100% on room air.   Objective: Vital signs in last 24 hours: Filed Vitals:   10/28/12 1715 10/28/12 1925 10/29/12 0048 10/29/12 0458  BP: 140/99 155/109 169/111 148/93  Pulse: 85 90 83 83  Temp:  97.4 F (36.3 C)  97 F (36.1 C)  TempSrc:  Oral  Oral  Resp: 35 20 18 21   Height:  5\' 10"  (1.778 m)    Weight:  258 lb 3.2 oz (117.119 kg)  257 lb 4.8 oz (116.711 kg)  SpO2: 81% 100% 100% 96%  Satting 95-100% on room air at rest and with ambulation  Weight change:   Intake/Output Summary (Last 24 hours) at 10/29/12 1249 Last data filed at 10/29/12 1136  Gross per 24 hour  Intake    810 ml  Output   3800 ml  Net  -2990 ml   Physical Exam General: lying flat and appears comfortable, alert, cooperative, and in no apparent distress HEENT: NCAT, MMM, vision grossly intact Neck: supple, no lymphadenopathy, JVD Lungs: clear to ascultation bilaterally, normal work of respiration, no wheezes, rales, ronchi Heart: regular rate and rhythm, no murmurs, gallops, or rubs Abdomen: obese, soft, TTP LLQ/flank, non-distended, normal bowel sounds Extremities: warm extremities bilaterally, no BLE edema Neurologic: alert & oriented X3, cranial nerves II-XII intact, strength grossly intact, sensation intact to light touch  Lab Results: Basic Metabolic Panel:  Recent Labs Lab 10/28/12 1131 10/28/12 1225 10/29/12 0600  NA 131*  --  133*  K 4.9  --  4.2  CL 95*  --  97  CO2 23  --  25  GLUCOSE 151*  --  113*  BUN 22  --  23    CREATININE 1.14  --  1.13  CALCIUM 8.8  --  8.8  MG  --  2.1  --   PHOS  --  4.5  --    Liver Function Tests:  Recent Labs Lab 10/28/12 1225 10/29/12 0600  AST 428* 322*  ALT 289* 316*  ALKPHOS 89 94  BILITOT 3.2* 1.9*  PROT 7.1 6.7  ALBUMIN 3.6 3.2*    Recent Labs Lab 10/28/12 1225  LIPASE 37   CBC:  Recent Labs Lab 10/26/12 1925 10/28/12 1131  WBC 9.6 9.3  HGB 13.4 13.6  HCT 38.4* 37.8*  MCV 80.5 80.3  PLT 254 257   Cardiac Enzymes:  Recent Labs Lab 10/26/12 2053  TROPONINI <0.30   BNP:  Recent Labs Lab 10/26/12 1927 10/28/12 1225  PROBNP 6306.0* 4543.0*   D-Dimer:  Recent Labs Lab 10/28/12 1225  DDIMER 2.36*   CBG:  Recent Labs Lab 10/28/12 1937 10/29/12 0653 10/29/12 1144  GLUCAP 98 106* 122*   Hemoglobin A1C:  Recent Labs Lab 10/29/12 0600  HGBA1C 6.2*   Fasting Lipid Panel:  Recent Labs Lab 10/29/12 0600  CHOL 136  HDL 15*  LDLCALC 105*  TRIG 79  CHOLHDL 9.1   Coagulation:  Recent Labs Lab 10/29/12 0600  LABPROT 18.5*  INR 1.59*   Misc. Labs: HIV  non reactive Hep A IgM pending HBsAg negative Hep B C IgM pending Hep C Ab neg  Micro Results: No results found for this or any previous visit (from the past 240 hour(s)).  Studies/Results: Ct Angio Chest Pe W/cm &/or Wo Cm  10/28/2012   CLINICAL DATA:  Shortness of Breath  EXAM: CT ANGIOGRAPHY CHEST WITH CONTRAST  TECHNIQUE: Multidetector CT imaging of the chest was performed using the standard protocol during bolus administration of intravenous contrast. Multiplanar CT image reconstructions including MIPs were obtained to evaluate the vascular anatomy.  CONTRAST:  OMNIPAQUE IOHEXOL 350 MG/ML SOLN  COMPARISON:  Chest radiograph October 28, 2012  FINDINGS: There is no demonstrable pulmonary embolus. There is no thoracic aortic aneurysm.  Prominence of the hepatic veins and inferior vena cava raise question of a degree of right heart failure.  On axial  slice 63, there is an 8 by 7 mm nodular opacity abutting the pleura in the lateral segment of the left lower lobe. On this same slice, there is a 5 x 5 mm nodular opacity in the periphery of the posterior segment of the right lower lobe. The interstitium is diffusely prominent with a suspected mild degree of interstitial edema. There is no airspace consolidation. There is a small right pleural effusion.  There are several lymph nodes adjacent to the aortic arch and proximal descending thoracic aorta extending to the aortopulmonary window. The largest of these lymph nodes measures 2.2 by 1.6 cm. There is a lymph node just anterior to the Carina on the right measuring 2.0 by 1.5 cm. Several smaller lymph nodes are noted in the hila and mediastinum. There is a right hilar lymph node measuring 1.5 x 1.4 cm.  Heart is mildly enlarged. There is a minimal pericardial effusion.  The visualized upper abdominal structures appear normal except for prominence of the visualized inferior vena cava and hepatic veins.  There are no blastic or lytic bone lesions. Thyroid appears normal.  Review of the MIP images confirms the above findings.  IMPRESSION: No demonstrable pulmonary embolus.  Suspect a degree of congestive heart failure. There is felt to be a degree of right sided heart failure as at least part of the congestive heart failure pattern.  Two nodular opacities in the left lower lobe. Followup of these nodular lesion should be based on Fleischner society guidelines.  Mild adenopathy of uncertain etiology.  Minimal pericardial effusion.  If the patient is at high risk for bronchogenic carcinoma, follow-up chest CT at 3-50months is recommended. If the patient is at low risk for bronchogenic carcinoma, follow-up chest CT at 6-12 months is recommended. This recommendation follows the consensus statement: Guidelines for Management of Small Pulmonary Nodules Detected on CT Scans: A Statement from the Fleischner Society as  published in Radiology 2005; 237:395-400.   Electronically Signed   By: Bretta Bang   On: 10/28/2012 15:16   Dg Abd Acute W/chest  10/28/2012   CLINICAL DATA:  Upper abdominal pain. Cough. Bronchitis. And subsequent encounter.  EXAM: ACUTE ABDOMEN SERIES (ABDOMEN 2 VIEW & CHEST 1 VIEW)  COMPARISON:  Two-view chest 10/26/2012.  FINDINGS: The heart is mildly enlarged. Extensive interstitial coarsening is again noted. No focal airspace consolidation is evident. Small bilateral pleural effusions are more evident.  Supine and upright views the abdomen demonstrate a nonspecific bowel gas pattern. There is no evidence for obstruction or free air.  IMPRESSION: 1. Persistent diffuse interstitial pattern compatible with severe bronchitis or early bronchopneumonia. 2. Small bilateral pleural  effusions. 3. Normal radiographic appearance of the abdomen.   Electronically Signed   By: Gennette Pac   On: 10/28/2012 12:46   Medications: I have reviewed the patient's current medications. Scheduled Meds: . aspirin EC  81 mg Oral Daily  . atorvastatin  20 mg Oral q1800  . cefTRIAXone (ROCEPHIN)  IV  1 g Intravenous Q24H  . doxycycline  100 mg Oral Q12H  . enoxaparin (LOVENOX) injection  40 mg Subcutaneous Q24H  . insulin aspart  0-15 Units Subcutaneous TID WC  . lisinopril  5 mg Oral Daily  . sodium chloride  3 mL Intravenous Q12H   Continuous Infusions:  PRN Meds:.albuterol, chlorpheniramine-HYDROcodone, diphenhydrAMINE, ipratropium  Assessment/Plan:  #Shortness of breath- Bronchitis vs. PNA vs. CHF, likely multifactorial. CXR on 9/17 showed "persistent diffuse interstitial pattern compatible with severe bronchitis or early bronchopenumonia." Pt afebrile, VSS, no leukocytosis. He did desat in the ED to the 80s with ambulation, but today patient does not require supplemental O2 either at rest or with ambulation.  Pt does not have known history of CHF though it is on his problem list. Of note, proBNP  elevated at 4543 but decreased from 6306 on 9/15, and pt does endorse some orthopnea. On exam, pt does not appear volume overloaded, no crackles on lung exam, no lower extremity edema. Therefore, will not continue to diurese but will obtain echo, monitor daily weights and Is and Os. Pt has long QT (506), was started on Levaquin on 9/15 for presumed CAP at urgent care center, pharmacy recommended change in therapy since no improvement in symptoms after 48 hours, need to avoid QT prolonging meds including azithromycin. Mg, Phos within normal limits.  -telemetry give long QT -doxycycline 100mg  PO BID x 5-7 days, today will start amoxicillin 1g TID (s/p Rocephin IV x 1 dose in ED)- Pt on day 2/7 abx therapy  -duonebs q4h prn shortness of breath as pt previously got some relief with nebs  -echo pending -pulse ox  -incentive spirometry  -repeat CT chest in 3-6 months per rads recs due to pulmonary nodules  -tussionex prn cough  -request records from [facility]  -consider PFTs as outpatient   #HTN- Mostly 150s-160s/100s-120s. Pt not on any home antihypertensives, HTN just diagnosed at urgent care this week.  -start lisinopril 5 mg daily, first dose 9/18 -start coreg 6.25mg  BID  #Previous diagnosis of DM- Apparently pt was diagnosed with diabetes at urgent care this week. However, HbA1c is 6.2%. Therefore, from the information I have, patient does not have diabetes. However, he is overweight and at risk for development of diabetes in the future. He will definitely need outpatient follow up of his blood glucose. -heart healthy diet   -discontinue SSI and CBG monitoring since he is no longer considered diabetic  #Elevated transaminases- Unclear etiology at this time. AST 428, ALT 289, GGT 141; likely due to EtOH abuse, especially given close to 2:1 AST to ALT ratio and elevated GGT. Pt states he drinks only on weekends, will drink up to 2 six packs of beer by himself, last drink on ?Saturday  9/13. Denies any alcohol use during the weekdays. Patient was recently on levaquin, which can be hepatotoxic, so this should also be considered especially if transaminitis improves in the short term given we stopped giving it to patient on 9/17. -hold off on CIWA protocol for now due to low concern for withdrawal  -hepatitis panel- so far negative, will f/u -HIV nonreactive -INR elevated at 1.59 and  albumin low at 3.2 indicating that liver function may be impaired  #LLQ Musculoskeletal abdominal pain- There is only mild TTP and is likely due to recent coughing. Lipase 37, no no concern for pancreatitis. Consider starting NSAID if pain persists though pt's fiance believes he has side effects from Naproxen so could try ibuprofen instead.   #CAD- There is no concern for ACS given no chest pain, no ST changes on EKG, troponin x 1 negative. -consider obtaining records from hospital in [place]  -started lipitor 20mg  daily today -lipid panel 9/18- Total cholesterol 136, TG 79, HDL 15, LDL 105 -consider beta blocker tomorrow if BP can handle it   #Tobacco abuse- quit smoking 1 week ago, no nicotine patch needed/desired   #Presumed OSA- Pt admits to snoring and apnea at night. Does not endorse HA, but states he easily falls asleep during the day.  -will need outpatient sleep study as this may be contributing to his presumed right sided heart failure (per CT scan) -CPAP tonight to see how patient responds   #DVT ppx- lovenox, SCDs   #Code status- Full code  Dispo: Disposition is deferred at this time, awaiting improvement of current medical problems.  Anticipated discharge in approximately 1-2 day(s).   The patient does not have a current PCP (No Pcp Per Patient) and does need an [facility] hospital follow-up appointment after discharge.  The patient does not have transportation limitations that hinder transportation to clinic appointments.  .Services Needed at time of discharge: Y = Yes, Blank =  No PT:   OT:   RN:   Equipment:   Other:     LOS: 1 day   Windell Hummingbird, MD 10/29/2012, 12:49 PM

## 2012-10-29 NOTE — Progress Notes (Signed)
Pt a/o, no c/o pain, pt c/o cough PRN cough meds given as ordered, po abx given as ordered, pt stable will continue to monitor

## 2012-10-29 NOTE — Progress Notes (Signed)
Placed pt. On CPAP auto titrate (Min: 4, Max: 15) via nasal mask. Pt. Is tolerating CPAP well at this time without any complications. Pt. Has never had a sleep study. It would be beneficial for a sleep study to be done upon discharge.

## 2012-10-29 NOTE — Progress Notes (Signed)
Inpatient Diabetes Program Recommendations  AACE/ADA: New Consensus Statement on Inpatient Glycemic Control (2013)  Target Ranges:  Prepandial:   less than 140 mg/dL      Peak postprandial:   less than 180 mg/dL (1-2 hours)      Critically ill patients:  140 - 180 mg/dL   Reason for Visit: Note referral received regarding new diagnosis of diabetes.  A1C=6.2% and MD notes that this is not diagnostic of diabetes.  Gave patient information regarding Pre-diabetes and prevention of diabetes.  He states that Jamison Neighbor has been to see him and brought him a copy of "Living Well with Diabetes" booklet.  He said "I like what you are telling me".  He and his wife are interested in eating better and living a healthier lifestyle.  She requested that someone speak to him regarding diet.  Wife states that she has Lupita Leash Riley's card also and she plans to call her after discharge.

## 2012-10-29 NOTE — H&P (Signed)
  Date: 10/29/2012  Patient name: Panayiotis Rainville  Medical record number: [id number]  Date of birth: 15-May-1960   I have seen and evaluated Johnnette Litter and discussed their care with the Residency Team. Mr Storlie was admitted with acute respiratory failure. His ABX were broadened, he was given one dose of IV lasix and diuresed over 3 liters, and was treated with O2.     Assessment and Plan: I have seen and evaluated the patient as outlined above. I agree with the formulated Assessment and Plan as detailed in the residents' admission note, with the following changes:   1. Acute Respiratory failure - stable now. CAP being treated with IV ABX. No additional diuresis until get ECHO results. His CT suggested RHF so will get ECHO. He does have several sxs of OSA so will rec outpt sleep study.   2. Newly Dx HTN - ACEI started bc there is a concern for systolic HF and he is newly dx DM. However, he does present with a cough. If cough worsens or doesn't improve, may need to change ACEI.   3. Newly Dx DM II - A1C is pending. CBG's here in hospital are great. Only SSI for now.   4. Increased LFT's - most likely cause is ETOH. But Hep panel is pending. NASH is also a likely cause as he has ABD obesity.   5. H/O CAD - pt is on ASA. He states he was on statin after the MI but is not taking it now and denies ever taking a BB. Get records.  Burns Spain, MD 9/18/201411:10 AM

## 2012-10-29 NOTE — Progress Notes (Signed)
SATURATION QUALIFICATIONS: (This note is used to comply with regulatory documentation for home oxygen)  Patient Saturations on Room Air at Rest = 99%  Patient Saturations on Room Air while Ambulating = 95-100%  Patient Saturations on room air while Ambulating = 95-100%  Please briefly explain why patient needs home oxygen: pt ambulated whole length of hallway with O2 95-100%, no c/o SOB

## 2012-10-29 NOTE — Progress Notes (Signed)
Pt. C/o constant cough. PRN Tussionex ordered every 12 hours. Pt. Last dose at 1405. On call for Internal med made aware. Ok to give medication now. RN will continue to monitor pt. For changes in condition. Sly Parlee, Cheryll Dessert

## 2012-10-30 ENCOUNTER — Encounter (HOSPITAL_COMMUNITY): Payer: Self-pay | Admitting: Interventional Cardiology

## 2012-10-30 DIAGNOSIS — I059 Rheumatic mitral valve disease, unspecified: Secondary | ICD-10-CM

## 2012-10-30 LAB — COMPREHENSIVE METABOLIC PANEL
ALT: 275 U/L — ABNORMAL HIGH (ref 0–53)
Alkaline Phosphatase: 89 U/L (ref 39–117)
BUN: 23 mg/dL (ref 6–23)
CO2: 24 mEq/L (ref 19–32)
Calcium: 8.7 mg/dL (ref 8.4–10.5)
Chloride: 100 mEq/L (ref 96–112)
GFR calc Af Amer: 89 mL/min — ABNORMAL LOW (ref >90)
GFR calc non Af Amer: 77 mL/min — ABNORMAL LOW (ref >90)
Glucose, Bld: 115 mg/dL — ABNORMAL HIGH (ref 70–99)
Potassium: 4.8 mEq/L (ref 3.5–5.1)
Total Protein: 6.5 g/dL (ref 6.0–8.3)

## 2012-10-30 LAB — GLUCOSE, CAPILLARY
Glucose-Capillary: 114 mg/dL — ABNORMAL HIGH (ref 70–99)
Glucose-Capillary: 114 mg/dL — ABNORMAL HIGH (ref 70–99)
Glucose-Capillary: 130 mg/dL — ABNORMAL HIGH (ref 70–99)

## 2012-10-30 LAB — GAMMA GT: GGT: 132 U/L — ABNORMAL HIGH (ref 7–51)

## 2012-10-30 MED ORDER — FUROSEMIDE 10 MG/ML IJ SOLN
40.0000 mg | Freq: Three times a day (TID) | INTRAMUSCULAR | Status: DC
  Administered 2012-10-30 – 2012-11-02 (×9): 40 mg via INTRAVENOUS
  Filled 2012-10-30 (×12): qty 4

## 2012-10-30 MED ORDER — LISINOPRIL 5 MG PO TABS
5.0000 mg | ORAL_TABLET | Freq: Once | ORAL | Status: AC
  Administered 2012-10-30: 5 mg via ORAL
  Filled 2012-10-30: qty 1

## 2012-10-30 NOTE — Progress Notes (Signed)
  Date: 10/30/2012  Patient name: Devon [name]  Medical record number: [id number]  Date of birth: May 24, 1960   This patient has been seen and the plan of care was discussed with the house staff. Please see their note for complete details. I concur with their findings with the following additions/corrections: [name]' ECHO shows B systolic heart failure. He has known CAD and we are getting old records. Last cath and sent was 2004 and last stress test 5 yrs ago. Most common cause of RHF is LHF but we highly suspect undx OSA and he will need a sleep study as outpt. He has no signs / sxs that would indicate chronic VTE or connective tissue dz as cause of RHF. He is HIV negative. He is at risk of COPD and he will need PFT's as an outpt. Cards to see pt today.  Burns Spain, MD 10/30/2012, 12:24 PM

## 2012-10-30 NOTE — Progress Notes (Signed)
PT. With elevated BP this am. On call for Internal Med made aware. New orders received. RN will implement new orders and continue to monitor pt. For changes in condition.Blood pressure 158/112, pulse 71, temperature 97.4 F (36.3 C), temperature source Axillary, resp. rate 22, height 5\' 10"  (1.778 m), weight 116.711 kg (257 lb 4.8 oz), SpO2 93.00%.  Katilin Raynes, Cheryll Dessert

## 2012-10-30 NOTE — Consult Note (Addendum)
Admit date: 10/28/2012 Referring Physician  The Medical Teaching Service Primary Physician  None Primary Cardiologist  HWBSmith, III, MD Reason for Consultation  CHF  ASSESSMENT: 1. Acute on chronic systolic heart failure, possibly precipitated by NSAID therapy 2-3 weeks ago. Basic etiology of CSHF  is likely ischemic in setting of old anterior MI.Functional status class 3-4 2. Prior Myocardial infarction 2004, treated with stenting x 2 3. Hypertension 4. Probable sleep apnea 5. Diabetes mellitus  PLAN: 1. Aggressive diuresis to improve symptoms. Start with 40 mg IV q 8 hours. 2. Optimize heart failure regimen after adequately diuresed. 3. Given prior MI, would empirically add long acting nitrates. 4. Once heart failure is compensated, he will need coronary angiography. 5. Daily weight and I/O 6. Will heart failure service consult. I will arrange. 7. Will likely need AICD/?BiV device if EF remains low on optimal therapy. 8. Aspirin, statin therapy 9. Will need sleep study   HPI: 52 yo AA male with prior history anterolateral MI 2004 treated with stents. Three weeks ago he developed back pain and was started on Naproxen and Flexeril. According to fiance, breathing was not normal before starting the medications..He became more and more short of breath the longer he stayed on the medication. He eventually stopped the therapy. Starting 2 weeks ago he developed severe orthopnea and PND. This led to multiple Urgent Care/ER visits where he unfortunately was treated for Pna/Bronchitis based on the radiologists x- ray report. He denies edema but has had tachy-palpitations.   PMH:   Past Medical History  Diagnosis Date  . Coronary artery disease   . Hypertension   . CHF (congestive heart failure)   . Diabetes mellitus without complication      PSH:   Past Surgical History  Procedure Laterality Date  . Coronary stents      Allergies:  Review of patient's allergies indicates no known  allergies. Prior to Admit Meds:   Prescriptions prior to admission  Medication Sig Dispense Refill  . aspirin EC 81 MG tablet Take 81 mg by mouth daily.      Marland Kitchen dextromethorphan-guaiFENesin (MUCINEX DM) 30-600 MG per 12 hr tablet Take 1 tablet by mouth every 12 (twelve) hours.  14 tablet  0  . levofloxacin (LEVAQUIN) 500 MG tablet Take 1.5 tablets (750 mg total) by mouth daily.  5 tablet  0   Fam HX:    Family History  Problem Relation Age of Onset  . Hypertension Father     father, brother, sister  . Diabetes      sister  . Coronary artery disease Sister     sister  . Breast cancer Mother   . Breast cancer Sister    Social HX:    History   Social History  . Marital Status: Single    Spouse Name: N/A    Number of Children: N/A  . Years of Education: N/A   Occupational History  . Not on file.   Social History Main Topics  . Smoking status: Current Every Day Smoker -- 0.25 packs/day    Types: Cigarettes  . Smokeless tobacco: Not on file  . Alcohol Use: Yes  . Drug Use: No  . Sexual Activity: Yes   Other Topics Concern  . 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 Has been heavy drinker in past.No Illicit drug use.  No liver, lung, kidney, or neurovascular disease. Loud snoring.Marland Kitchen  Physical Exam: Blood pressure 134/107, pulse 70, temperature 97.5 F (36.4 C), temperature source Oral, resp. rate 22, height 5\' 10"  (1.778 m), weight 116.711 kg (257 lb 4.8 oz), SpO2 95.00%. Weight change:    Middle aged african [place] man with no acute distress sitting in a chair and intermittently coughing from time to time. "Bull neck" without JVD appreciated (difficult exam) Decreased breath sounds at both bases with mid lung rales. There is an S3 gallop. No murmur. Extremities reveal no edema. Neuro exam is unremarkable. Labs:   Lab Results  Component Value Date   WBC 9.3 10/28/2012   HGB 13.6  10/28/2012   HCT 37.8* 10/28/2012   MCV 80.3 10/28/2012   PLT 257 10/28/2012     Recent Labs Lab 10/30/12 0450  NA 135  K 4.8  CL 100  CO2 24  BUN 23  CREATININE 1.08  CALCIUM 8.7  PROT 6.5  BILITOT 1.7*  ALKPHOS 89  ALT 275*  AST 201*  GLUCOSE 115*   No results found for this basename: PTT   Lab Results  Component Value Date   INR 1.59* 10/29/2012   Lab Results  Component Value Date   TROPONINI <0.30 10/30/2012     Lab Results  Component Value Date   CHOL 136 10/29/2012   Lab Results  Component Value Date   HDL 15* 10/29/2012   Lab Results  Component Value Date   LDLCALC 105* 10/29/2012   Lab Results  Component Value Date   TRIG 79 10/29/2012   Lab Results  Component Value Date   CHOLHDL 9.1 10/29/2012   No results found for this basename: LDLDIRECT      Radiology:  Chest X ray: from both 9/15 and 9/17 reveals CHF with bilateral effusions.  EKG:  NSR, biatrial abnormality, old anterolateral MI  ECHOCARDIOGRAM: 10/30/12 PERFORMING [place], [facility] Blanch [employer] SONOGRAPHER [place], RDCS, CCT cc:  ------------------------------------------------------------ LV EF: 15%  ------------------------------------------------------------ Indications: CHF - 428.0.  ------------------------------------------------------------ History: PMH: Dyspnea. Risk factors: Current tobacco use. Hypertension. Diabetes mellitus.  ------------------------------------------------------------ Study Conclusions  - Left ventricle: The cavity size was mildly dilated. Wall thickness was normal. The estimated ejection fraction was 15%. Diffuse hypokinesis. Doppler parameters are consistent with restrictive physiology, indicative of decreased left ventricular diastolic compliance and/or increased left atrial pressure. - Mitral valve: Mild regurgitation. - Left atrium: The atrium was mildly dilated. - Right ventricle: The cavity size was mildly  dilated. Systolic function was severely reduced. - Right atrium: The atrium was mildly dilated. - Pulmonary arteries: Systolic pressure was mildly increased. PA peak pressure: 35mm Hg (S). - Pericardium, extracardiac: A trivial pericardial effusion was identified.  Complicated situation and greater than 2 hours spent with the patient and friend answering questions and discussion the diagnosis and prognosis.  Lesleigh Noe 10/30/2012 5:16 PM

## 2012-10-30 NOTE — Progress Notes (Signed)
  RD consulted for nutrition education regarding diabetes. Per MD Resident note, pt does not have diabetes; however, he is overweight and at risk for development of diabetes in the future. RD proceeded to provide diet education emphasizing that it can help patient prevent diabetes.  Lab Results  Component Value Date   HGBA1C 6.2* 10/29/2012    RD provided "Diabetes Nutrition Therapy" handout from the [employer]. Discussed different food groups and their effects on blood sugar, emphasizing carbohydrate-containing foods. Encouraged pt to limit/avoid sweets, desserts, and sugar-sweetened beverages. Provided list of carbohydrates and recommended serving sizes of common foods.  Discussed importance of controlled and consistent carbohydrate intake throughout the day. Provided examples of ways to balance meals/snacks and encouraged intake of high-fiber, whole grain complex carbohydrates. Encouraged increased intake of vegetables and increased physical activity. Teach back method used. Pt coughing and falling asleep during education. Expect fair compliance.  Body mass index is 36.92 kg/(m^2). Pt meets criteria for Obesity based on current BMI.  Current diet order is Carb Modified, patient is consuming approximately 100% of meals at this time. Labs and medications reviewed. No further nutrition interventions warranted at this time. RD contact information provided- encouraged pt to call with and questions or concerns. If additional nutrition issues arise, please re-consult RD.  Ian Malkin RD, LDN Inpatient Clinical Dietitian Pager: [phone] After Hours Pager: [phone]

## 2012-10-30 NOTE — Progress Notes (Signed)
RN notified that pt. C/o dizziness. RN in the room to assess pt. Wife at bedside stated that pt. Was in the restroom having a bowel movement and c/o dizziness while walking back to the bed. Pt. Also c/o lower abdominal pain. Pt. Restless in bed. Blood pressure 122/94, pulse 77, temperature 97.8 F (36.6 C), temperature source Oral, resp. rate 20, height 5\' 10"  (1.778 m), weight 116.711 kg (257 lb 4.8 oz), SpO2 99.00%. On call MD, Blanch Media, made aware. No new orders received at this time. RN will continue to  Monitor pt. For changes in condition. Erie Sica, Cheryll Dessert

## 2012-10-30 NOTE — Progress Notes (Signed)
Pt a/o, no c/o pain, pt oob ad lib, will continue to monitor

## 2012-10-30 NOTE — Progress Notes (Addendum)
Subjective: Patient reports that his SOB and cough have improved. Per RN, patient had episode of dizziness around midnight last night after he had a BM. No CP, SOB, diaphoresis at this time, BP was 122/94 at this time. Patient did not sleep well last night due to some anxiety he had after trying to use the CPAP machine, which he has never used before in the past. Patient's BP was elevated at 171/108 this AM at which time he received an early dose of his daily coreg and lisinopril. Since this time patient's BP has ranged from 137-158/97-112. Echo not not yesterday.   Objective: Vital signs in last 24 hours: Filed Vitals:   10/29/12 2302 10/29/12 2357 10/30/12 0558 10/30/12 0957  BP:  122/94 158/112 137/97  Pulse: 82 77 71   Temp:   97.4 F (36.3 C)   TempSrc:   Axillary   Resp: 20  22   Height:      Weight:      SpO2: 99%  93%   room air  Weight change:   Intake/Output Summary (Last 24 hours) at 10/30/12 1140 Last data filed at 10/30/12 0826  Gross per 24 hour  Intake    603 ml  Output   1125 ml  Net   -522 ml   Physical Exam General: lying flat and appears comfortable, alert, cooperative, and in no apparent distress HEENT: NCAT, MMM, vision grossly intact Neck: supple, no lymphadenopathy, no JVD Lungs: clear to ascultation bilaterally, normal work of respiration, no wheezes, rales, ronchi Heart: regular rate and rhythm, no murmurs, gallops, or rubs Abdomen: obese, protuberant abdomen that is firm, TTP LLQ/flank-improved from yesterday, normal bowel sounds Extremities: warm extremities bilaterally, trace BLE edema Neurologic: alert & oriented X3, cranial nerves II-XII intact, strength grossly intact, sensation intact to light touch  Lab Results: Basic Metabolic Panel:  Recent Labs Lab 10/28/12 1131 10/28/12 1225 10/29/12 0600 10/30/12 0450  NA 131*  --  133* 135  K 4.9  --  4.2 4.8  CL 95*  --  97 100  CO2 23  --  25 24  GLUCOSE 151*  --  113* 115*  BUN 22  --   23 23  CREATININE 1.14  --  1.13 1.08  CALCIUM 8.8  --  8.8 8.7  MG  --  2.1  --   --   PHOS  --  4.5  --   --    Liver Function Tests:  Recent Labs Lab 10/29/12 0600 10/30/12 0450  AST 322* 201*  ALT 316* 275*  ALKPHOS 94 89  BILITOT 1.9* 1.7*  PROT 6.7 6.5  ALBUMIN 3.2* 3.2*    Recent Labs Lab 10/28/12 1225  LIPASE 37   CBC:  Recent Labs Lab 10/26/12 1925 10/28/12 1131  WBC 9.6 9.3  HGB 13.4 13.6  HCT 38.4* 37.8*  MCV 80.5 80.3  PLT 254 257   Cardiac Enzymes:  Recent Labs Lab 10/26/12 2053  TROPONINI <0.30   BNP:  Recent Labs Lab 10/26/12 1927 10/28/12 1225  PROBNP 6306.0* 4543.0*   D-Dimer:  Recent Labs Lab 10/28/12 1225  DDIMER 2.36*   CBG:  Recent Labs Lab 10/28/12 1937 10/29/12 0653 10/29/12 1144 10/29/12 1620 10/30/12 0549 10/30/12 1117  GLUCAP 98 106* 122* 174* 114* 114*   Hemoglobin A1C:  Recent Labs Lab 10/29/12 0600  HGBA1C 6.2*   Fasting Lipid Panel:  Recent Labs Lab 10/29/12 0600  CHOL 136  HDL 15*  LDLCALC 105*  TRIG  79  CHOLHDL 9.1   Coagulation:  Recent Labs Lab 10/29/12 0600  LABPROT 18.5*  INR 1.59*   Misc. Labs: HIV non reactive Hep A IgM negative HBsAg negative Hep B C IgM negative Hep C Ab neg  Micro Results: No results found for this or any previous visit (from the past 240 hour(s)).  Studies/Results: Ct Angio Chest Pe W/cm &/or Wo Cm  10/28/2012   CLINICAL DATA:  Shortness of Breath  EXAM: CT ANGIOGRAPHY CHEST WITH CONTRAST  TECHNIQUE: Multidetector CT imaging of the chest was performed using the standard protocol during bolus administration of intravenous contrast. Multiplanar CT image reconstructions including MIPs were obtained to evaluate the vascular anatomy.  CONTRAST:  OMNIPAQUE IOHEXOL 350 MG/ML SOLN  COMPARISON:  Chest radiograph October 28, 2012  FINDINGS: There is no demonstrable pulmonary embolus. There is no thoracic aortic aneurysm.  Prominence of the hepatic veins  and inferior vena cava raise question of a degree of right heart failure.  On axial slice 63, there is an 8 by 7 mm nodular opacity abutting the pleura in the lateral segment of the left lower lobe. On this same slice, there is a 5 x 5 mm nodular opacity in the periphery of the posterior segment of the right lower lobe. The interstitium is diffusely prominent with a suspected mild degree of interstitial edema. There is no airspace consolidation. There is a small right pleural effusion.  There are several lymph nodes adjacent to the aortic arch and proximal descending thoracic aorta extending to the aortopulmonary window. The largest of these lymph nodes measures 2.2 by 1.6 cm. There is a lymph node just anterior to the Carina on the right measuring 2.0 by 1.5 cm. Several smaller lymph nodes are noted in the hila and mediastinum. There is a right hilar lymph node measuring 1.5 x 1.4 cm.  Heart is mildly enlarged. There is a minimal pericardial effusion.  The visualized upper abdominal structures appear normal except for prominence of the visualized inferior vena cava and hepatic veins.  There are no blastic or lytic bone lesions. Thyroid appears normal.  Review of the MIP images confirms the above findings.  IMPRESSION: No demonstrable pulmonary embolus.  Suspect a degree of congestive heart failure. There is felt to be a degree of right sided heart failure as at least part of the congestive heart failure pattern.  Two nodular opacities in the left lower lobe. Followup of these nodular lesion should be based on Fleischner society guidelines.  Mild adenopathy of uncertain etiology.  Minimal pericardial effusion.  If the patient is at high risk for bronchogenic carcinoma, follow-up chest CT at 3-93months is recommended. If the patient is at low risk for bronchogenic carcinoma, follow-up chest CT at 6-12 months is recommended. This recommendation follows the consensus statement: Guidelines for Management of Small  Pulmonary Nodules Detected on CT Scans: A Statement from the Fleischner Society as published in Radiology 2005; 237:395-400.   Electronically Signed   By: Bretta Bang   On: 10/28/2012 15:16   Dg Abd Acute W/chest  10/28/2012   CLINICAL DATA:  Upper abdominal pain. Cough. Bronchitis. And subsequent encounter.  EXAM: ACUTE ABDOMEN SERIES (ABDOMEN 2 VIEW & CHEST 1 VIEW)  COMPARISON:  Two-view chest 10/26/2012.  FINDINGS: The heart is mildly enlarged. Extensive interstitial coarsening is again noted. No focal airspace consolidation is evident. Small bilateral pleural effusions are more evident.  Supine and upright views the abdomen demonstrate a nonspecific bowel gas pattern. There is no  evidence for obstruction or free air.  IMPRESSION: 1. Persistent diffuse interstitial pattern compatible with severe bronchitis or early bronchopneumonia. 2. Small bilateral pleural effusions. 3. Normal radiographic appearance of the abdomen.   Electronically Signed   By: Gennette Pac   On: 10/28/2012 12:46   Echo 9/19- Left ventricle: The cavity size was mildly dilated. Wall thickness was normal. The estimated ejection fraction was 15%. Diffuse hypokinesis. Doppler parameters are consistent with restrictive physiology, indicative of decreased left ventricular diastolic compliance and/or increased left atrial pressure. - Mitral valve: Mild regurgitation. - Left atrium: The atrium was mildly dilated. - Right ventricle: The cavity size was mildly dilated. Systolic function was severely reduced. - Right atrium: The atrium was mildly dilated. - Pulmonary arteries: Systolic pressure was mildly increased. PA peak pressure: 35mm Hg (S). - Pericardium, extracardiac: A trivial pericardial effusion was identified.  Medications: I have reviewed the patient's current medications. Scheduled Meds: . amoxicillin  1,000 mg Oral Q8H  . aspirin EC  81 mg Oral Daily  . atorvastatin  20 mg Oral q1800  . carvedilol  6.25 mg Oral  BID WC  . doxycycline  100 mg Oral Q12H  . enoxaparin (LOVENOX) injection  40 mg Subcutaneous Q24H  . lisinopril  5 mg Oral Daily  . sodium chloride  3 mL Intravenous Q12H   Continuous Infusions:  PRN Meds:.albuterol, chlorpheniramine-HYDROcodone, diphenhydrAMINE, ipratropium  Assessment/Plan:  #Shortness of breath with new diagnosis of systolic CHF- Bronchitis vs. PNA vs. CHF, likely multifactorial. CXR on 9/17 showed "persistent diffuse interstitial pattern compatible with severe bronchitis or early bronchopenumonia." Pt afebrile, VSS, no leukocytosis. He did desat in the ED to the 80s with ambulation, but as of yesterday, patient no longer requires supplemental O2 either at rest or with ambulation.  Pt does not have known history of CHF, but Echo done today shows EF of 15% c/w systolic CHF as well as severely compromised function of R ventricle. Of note, proBNP elevated on admission at 4543 but is decreased from 6306 on 9/15, and pt does endorse some orthopnea. On exam on admission and today, pt does not appear volume overloaded, no crackles on lung exam, no lower extremity edema. Therefore, will not continue to diurese but will obtain echo, monitor daily weights and Is and Os. Pt had long QT (506) on admission and had been started on Levaquin on 9/15 for presumed CAP at urgent care center so this was promptly stopped. Will need to avoid QT prolonging medications. -cardiology consult- any further work up while patient is inpatient -telemetry given long QT -doxycycline 100mg  PO BID x 5-7 days, started amoxicillin 1g TID (s/p Rocephin IV x 1 dose in ED)- Pt on day 3/7 abx therapy  -duonebs q4h prn shortness of breath as pt previously got some relief with nebs  -pulse ox  -incentive spirometry  -repeat CT chest in 3-6 months per rads recs due to pulmonary nodules  -tussionex prn cough  -request records from [facility]  -consider PFTs as outpatient   #HTN- Patient's BP increased to  171/108 on one read early this morning, for which patient received is daily lisinopril and coreg early. BP now at 137/97. Will continue to monitor. He will need to be discharged on antihypertensives with close outpatient follow up as he was not previously on any antihypertensives and he does not have a PCP he follows with regularly.  -lisinopril 5 mg daily, first dose 9/18 -coreg 6.25mg  BID, first dose 9/18  #Previous diagnosis of DM-  Apparently pt was diagnosed with diabetes at urgent care this week. However, HbA1c is 6.2%. Therefore, from the information I have, patient does not have diabetes. However, he is overweight and at risk for development of diabetes in the future. He will definitely need outpatient follow up of his blood glucose. -heart healthy diet     #Elevated transaminases- Unclear etiology at this time. AST 428, ALT 289, GGT 141, total bili 3.2 on admission. Today LFTs are improved: AST 201, ALT 275, Total bili 1.7 and GGT 132. Likely due to EtOH abuse, especially given close to 2:1 AST to ALT ratio and elevated GGT. Pt states he drinks only on weekends, will drink up to 2 six packs of beer by himself, last drink on ?Saturday 9/13. Denies any alcohol use during the weekdays. Patient was recently on levaquin, which can be hepatotoxic, so this should also be considered especially since transaminitis is improving in the short term given we stopped levaquin on 9/17. -hepatitis panel- negative -HIV nonreactive  #LLQ Musculoskeletal abdominal pain- LLQ pain is improving today. Given his cough has also improved, this is likely due to recent coughing episodes w/ muscle strain. Lipase 37, no no concern for pancreatitis.   #CAD- Patient has h/o MI in 2004 in [place]. He has not had any chest pain, but did have one episode of dizziness overnight, no SOB, diaphoresis, or CP with this episode. Troponin was negative x 1 and EKG without ischemic changes on admission, but given this event overnight, will  assess for ACS once again with one time troponin level.  -obtain records from hospital in [place], [place]  -lipitor 20mg  daily started 9/18 -lipid panel 9/18- Total cholesterol 136, TG 79, HDL 15, LDL 105 -coreg 6.25mg  BID, started 9/18  #Tobacco abuse- quit smoking 1 week ago, no nicotine patch needed/desired   #Presumed OSA- Pt admits to snoring and apnea at night. Does not endorse HA, but states he easily falls asleep during the day. OSA may be cause of RHF.  -will need outpatient sleep study  -patient did not use CPAP much last night given he had much anxiety  #DVT ppx- lovenox, SCDs   #Code status- Full code  Dispo: Disposition is deferred at this time, awaiting improvement of current medical problems.  Anticipated discharge in approximately 1 day(s).   The patient does not have a current PCP (No Pcp Per Patient) and does need an [facility] hospital follow-up appointment after discharge.  The patient does not have transportation limitations that hinder transportation to clinic appointments.  .Services Needed at time of discharge: Y = Yes, Blank = No PT:   OT:   RN:   Equipment:   Other:     LOS: 2 days   Windell Hummingbird, MD 10/30/2012, 11:40 AM

## 2012-10-30 NOTE — Progress Notes (Signed)
  Echocardiogram 2D Echocardiogram has been performed.  Georgian Co 10/30/2012, 9:46 AM

## 2012-10-30 NOTE — Progress Notes (Signed)
Patient did not want to try the CPAP machine at this time. Patient is asleep resting comfortably, wife at bedside. Wife says she will call and let RT know if the patient wakes up later and changes his mind about trying the CPAP machine. CPAP is set up at patient bedside, ready to use when needed. RT will continue to assist as needed.

## 2012-10-31 DIAGNOSIS — R7989 Other specified abnormal findings of blood chemistry: Secondary | ICD-10-CM

## 2012-10-31 LAB — COMPREHENSIVE METABOLIC PANEL
ALT: 311 U/L — ABNORMAL HIGH (ref 0–53)
AST: 203 U/L — ABNORMAL HIGH (ref 0–37)
Albumin: 3.6 g/dL (ref 3.5–5.2)
Alkaline Phosphatase: 110 U/L (ref 39–117)
BUN: 26 mg/dL — ABNORMAL HIGH (ref 6–23)
CO2: 25 mEq/L (ref 19–32)
Calcium: 9.3 mg/dL (ref 8.4–10.5)
Chloride: 94 mEq/L — ABNORMAL LOW (ref 96–112)
Creatinine, Ser: 1.25 mg/dL (ref 0.50–1.35)
GFR calc Af Amer: 75 mL/min — ABNORMAL LOW (ref >90)
GFR calc non Af Amer: 65 mL/min — ABNORMAL LOW (ref >90)
Glucose, Bld: 96 mg/dL (ref 70–99)
Potassium: 3.9 mEq/L (ref 3.5–5.1)
Sodium: 133 mEq/L — ABNORMAL LOW (ref 135–145)
Total Bilirubin: 1.8 mg/dL — ABNORMAL HIGH (ref 0.3–1.2)
Total Protein: 7.6 g/dL (ref 6.0–8.3)

## 2012-10-31 LAB — GAMMA GT: GGT: 160 U/L — ABNORMAL HIGH (ref 7–51)

## 2012-10-31 LAB — GLUCOSE, CAPILLARY: Glucose-Capillary: 112 mg/dL — ABNORMAL HIGH (ref 70–99)

## 2012-10-31 MED ORDER — ISOSORBIDE MONONITRATE ER 30 MG PO TB24
30.0000 mg | ORAL_TABLET | Freq: Every day | ORAL | Status: DC
  Administered 2012-10-31 – 2012-11-01 (×2): 30 mg via ORAL
  Filled 2012-10-31 (×3): qty 1

## 2012-10-31 NOTE — Progress Notes (Signed)
1+ edema noted in bilateral lower extremities. Skin color is appropriate to race. Apical pulse was slightly irregular, with S1 heard clearly, S2 sounds were more difficult to identify. Skin is warm, dry, and intact.

## 2012-10-31 NOTE — Progress Notes (Signed)
Patient inquired as to whether he was still required to use his SCD's - advised to continue as per his prior routine until physician order is discontinued. Patient stated he will use them after noon meal.

## 2012-10-31 NOTE — Progress Notes (Signed)
Pt breathing unlabored. No c/o CP . Voiding freely. NSR. Continued to monitor.

## 2012-10-31 NOTE — Progress Notes (Signed)
Subjective: Pt states that he is doing well. He denies SOB and feels that his breathing is better. He says that he slept very well last night. He was told about his echo findings yesterday and has been seen by Cardiology who thinks that pt is volume overloaded and has started Lasix and a long acting nitrite.  Objective: Vital signs in last 24 hours: Filed Vitals:   10/31/12 0506 10/31/12 0757 10/31/12 1009 10/31/12 1113  BP: 145/90 132/68 138/80 146/94  Pulse: 67 68 70   Temp: 98 F (36.7 C)  97.6 F (36.4 C)   TempSrc: Oral  Oral   Resp: 19  21   Height:      Weight: 253 lb 8.5 oz (115 kg)     SpO2: 98%  96%    Weight change:   Intake/Output Summary (Last 24 hours) at 10/31/12 1250 Last data filed at 10/31/12 1006  Gross per 24 hour  Intake   1080 ml  Output   4095 ml  Net  -3015 ml   Vitals reviewed. General: resting in bed, NAD HEENT: PERRL, EOMI, no scleral icterus Cardiac: RRR, no rubs, murmurs or gallops Pulm: clear to auscultation bilaterally, no wheezes, rales, or rhonchi Abd: soft, nontender, nondistended, BS present Ext: warm and well perfused, no pedal edema Neuro: alert and oriented X3, cranial nerves II-XII grossly intact, strength and sensation to light touch equal in bilateral upper and lower extremities  Lab Results: Basic Metabolic Panel:  Recent Labs Lab 10/28/12 1131 10/28/12 1225  10/30/12 0450 10/31/12 0605  NA 131*  --   < > 135 133*  K 4.9  --   < > 4.8 3.9  CL 95*  --   < > 100 94*  CO2 23  --   < > 24 25  GLUCOSE 151*  --   < > 115* 96  BUN 22  --   < > 23 26*  CREATININE 1.14  --   < > 1.08 1.25  CALCIUM 8.8  --   < > 8.7 9.3  MG  --  2.1  --   --   --   PHOS  --  4.5  --   --   --   < > = values in this interval not displayed. Liver Function Tests:  Recent Labs Lab 10/30/12 0450 10/31/12 0605  AST 201* 203*  ALT 275* 311*  ALKPHOS 89 110  BILITOT 1.7* 1.8*  PROT 6.5 7.6  ALBUMIN 3.2* 3.6    Recent Labs Lab  10/28/12 1225  LIPASE 37   CBC:  Recent Labs Lab 10/26/12 1925 10/28/12 1131  WBC 9.6 9.3  HGB 13.4 13.6  HCT 38.4* 37.8*  MCV 80.5 80.3  PLT 254 257   Cardiac Enzymes:  Recent Labs Lab 10/26/12 2053 10/30/12 1040  TROPONINI <0.30 <0.30   BNP:  Recent Labs Lab 10/26/12 1927 10/28/12 1225  PROBNP 6306.0* 4543.0*   D-Dimer:  Recent Labs Lab 10/28/12 1225  DDIMER 2.36*   CBG:  Recent Labs Lab 10/29/12 1144 10/29/12 1620 10/30/12 0549 10/30/12 1117 10/30/12 1626 10/31/12 0543  GLUCAP 122* 174* 114* 114* 130* 112*   Hemoglobin A1C:  Recent Labs Lab 10/29/12 0600  HGBA1C 6.2*   Fasting Lipid Panel:  Recent Labs Lab 10/29/12 0600  CHOL 136  HDL 15*  LDLCALC 105*  TRIG 79  CHOLHDL 9.1   Coagulation:  Recent Labs Lab 10/29/12 0600  LABPROT 18.5*  INR 1.59*  Micro Results: No results found for this or any previous visit (from the past 240 hour(s)).  Studies/Results: 2D ECHO:  10/30/12: Study Conclusions  - Left ventricle: The cavity size was mildly dilated. Wall thickness was normal. The estimated ejection fraction was 15%. Diffuse hypokinesis. Doppler parameters are consistent with restrictive physiology, indicative of decreased left ventricular diastolic compliance and/or increased left atrial pressure. - Mitral valve: Mild regurgitation. - Left atrium: The atrium was mildly dilated. - Right ventricle: The cavity size was mildly dilated. Systolic function was severely reduced. - Right atrium: The atrium was mildly dilated. - Pulmonary arteries: Systolic pressure was mildly increased. PA peak pressure: 35mm Hg (S). - Pericardium, extracardiac: A trivial pericardial effusion was identified.  Medications: I have reviewed the patient's current medications. Scheduled Meds: . amoxicillin  1,000 mg Oral Q8H  . aspirin EC  81 mg Oral Daily  . atorvastatin  20 mg Oral q1800  . carvedilol  6.25 mg Oral BID WC  . doxycycline   100 mg Oral Q12H  . enoxaparin (LOVENOX) injection  40 mg Subcutaneous Q24H  . furosemide  40 mg Intravenous Q8H  . isosorbide mononitrate  30 mg Oral Daily  . lisinopril  5 mg Oral Daily  . sodium chloride  3 mL Intravenous Q12H   Continuous Infusions:  PRN Meds:.albuterol, chlorpheniramine-HYDROcodone, diphenhydrAMINE, ipratropium  Assessment/Plan:  Acute on chronic systolic CHF with right sided HF:  On admission he was being treated for PNA and CXR showed possible bronchopneumonia.  Pt afebrile, VSS, no leukocytosis. He did desat in the ED to the 80s with ambulation, but as of yesterday, patient no longer requires supplemental O2 either at rest or with ambulation. Pt did not have known history of CHF, but Echo done 9/19 showed EF of 15% c/w systolic CHF as well as severely compromised function of R ventricle. His proBNP was elevated on admission at 4543 which was decreased from 6306 on 9/15, and pt did endorse some orthopnea. On exam however, he does not appear volume overloaded, no crackles on lung exam, no lower extremity edema.  Cardiology was consulted and thinks the pt is volume overloaded as the pt c/o of continued SOB and orthopnea. Lasix 40 IVq8 and Imdur were started on 9/19. Pt will also likely need AICD or BiV device if his EF does not improved with optimal therapy. The pt seems to be doing well since starting the diuresis.  - Lasix 40mg  IV q8h until euvolemic, per Cards - Imdur 30mg  po daily - ASA 81 - Lipitor 20mg  daily - HF team consult per Cardiology - F/u with Cardiology, appreciate recommendations  #Possible bronchopneumonia: Seen on CXR, vs bronchitis. He was started on Rocephin in the ED which was changed to Doxy and Amoxicillin. On CXR, there was concern for possible pulmonary nodules that will need to be reevaluated by CT scan in 3-1mo. -doxycycline 100mg  PO BID x 5-7 days, started amoxicillin 1g TID (s/p Rocephin IV x 1 dose in ED)- Pt on day 4/7 abx therapy  -duonebs  q4h prn shortness of breath as pt previously got some relief with nebs  -pulse ox  -incentive spirometry  -repeat CT chest in 3-6 months per rads recs due to pulmonary nodules  -tussionex prn cough  -consider PFTs as outpatient in 3-6 weeks  #HTN: Patient's BP elevated on admission. He had never been on any antihypertensives previously. Lisinopril and Coreg were started on admission. With the addition of the Lasix, his blood pressure has been better controlled. Will  uptitrate once the Lasix has been decreased.  - Lasix 40mg  IV q8h - Lisinopril 5 mg daily - Coreg 6.25mg  BID  #Long QTc: Pt had long QT (506) on admission and had been started on Levaquin on 9/15 for presumed CAP at urgent care center so this was promptly stopped. Will need to avoid QT prolonging medications.   #Previous diagnosis of DM: Apparently pt was diagnosed with diabetes at urgent care this week. However, HbA1c is 6.2%, so he is not considered a diabetic, as >6.5% is considered diabetic. He is overweight and at risk for development of diabetes in the future. He will definitely need outpatient follow up of his blood glucose.  - Heart healthy diet   #Elevated transaminases: Unclear etiology at this time. AST 428, ALT 289, GGT 141, total bili 3.2 on admission. Today LFTs are improved: AST 201, ALT 275, Total bili 1.7 and GGT 132. Likely due to EtOH abuse, especially given close to 2:1 AST to ALT ratio and elevated GGT. Pt states he drinks only on weekends, will drink up to 2 six packs of beer by himself, last drink on ?Saturday 9/13. Denies any alcohol use during the weekdays. Patient was recently on levaquin, which can be hepatotoxic, so this should also be considered especially since transaminitis is improving in the short term given we stopped levaquin on 9/17. Hepatitis panel was negative and HIV nonreactive. Possibly from liver congestion 2/2 CHF.  - LFTs in the am  #LLQ Musculoskeletal abdominal pain: LLQ pain is improving.  Given his cough has also improved, this is likely due to recent coughing episodes w/ muscle strain. Lipase 37, no no concern for pancreatitis.   #CAD: Patient has h/o MI in 2004 in [place]. He has not had any chest pain, but did have one episode of dizziness overnight on 9/18, no SOB, diaphoresis, or CP with this episode. Troponin was negative x 2 and EKG without ischemic changes on admission. Lipid panel 9/18- Total cholesterol 136, TG 79, HDL 15, LDL 105  - Lipitor 20mg  daily started 9/18  - Coreg 6.25mg  BID, started 9/18  - Obtaining records from hospital in [place], [place]   #Tobacco abuse: Quit smoking 1 week ago, no nicotine patch needed/desired   #Presumed OSA: Pt admits to snoring and apnea at night. Does not endorse HA, but states he easily falls asleep during the day. OSA may be cause of RHF. Tried CPAP in the hospital, but it caused too much anxiety. He is willing to retry the CPAP. -will need outpatient sleep study   #DVT PPx: Lovenox, SCDs     Dispo: Disposition is deferred at this time, awaiting improvement of current medical problems.  Anticipated discharge in approximately 2-3 day(s).   The patient does have a current PCP Windell Hummingbird, MD) and does need an [facility] hospital follow-up appointment after discharge.  The patient does not have transportation limitations that hinder transportation to clinic appointments.  .Services Needed at time of discharge: Y = Yes, Blank = No PT:   OT:   RN:   Equipment:   Other:     LOS: 3 days   Genelle Gather, MD 10/31/2012, 12:50 PM

## 2012-10-31 NOTE — Progress Notes (Signed)
Subjective:  Breathing better, no CP. Ambulated. Good UOP. Lasix.   Marland Kitchen amoxicillin  1,000 mg Oral Q8H  . aspirin EC  81 mg Oral Daily  . atorvastatin  20 mg Oral q1800  . carvedilol  6.25 mg Oral BID WC  . doxycycline  100 mg Oral Q12H  . enoxaparin (LOVENOX) injection  40 mg Subcutaneous Q24H  . furosemide  40 mg Intravenous Q8H  . isosorbide mononitrate  30 mg Oral Daily  . lisinopril  5 mg Oral Daily  . sodium chloride  3 mL Intravenous Q12H   Objective:  Vital Signs in the last 24 hours: Temp:  [97.5 F (36.4 C)-98.2 F (36.8 C)] 98 F (36.7 C) (09/20 0506) Pulse Rate:  [67-72] 67 (09/20 0506) Resp:  [19-22] 19 (09/20 0506) BP: (103-145)/(47-107) 145/90 mmHg (09/20 0506) SpO2:  [95 %-98 %] 98 % (09/20 0506) Weight:  [115 kg (253 lb 8.5 oz)] 115 kg (253 lb 8.5 oz) (09/20 0506)  Intake/Output from previous day: 09/19 0701 - 09/20 0700 In: 960 [P.O.:960] Out: 3195 [Urine:3195]   Physical Exam: Middle aged african [place] man with no acute distress sitting in a chair and intermittently coughing from time to time.  "Bull neck" without JVD appreciated (difficult exam)  Decreased breath sounds at both bases with mid lung rales.  There is an S3 gallop. No murmur.  Extremities reveal no edema.  Protuberant abdomen Neuro exam is unremarkable.     Lab Results:  Recent Labs  10/28/12 1131  WBC 9.3  HGB 13.6  PLT 257    Recent Labs  10/30/12 0450 10/31/12 0605  NA 135 133*  K 4.8 3.9  CL 100 94*  CO2 24 25  GLUCOSE 115* 96  BUN 23 26*  CREATININE 1.08 1.25    Recent Labs  10/30/12 1040  TROPONINI <0.30   Hepatic Function Panel  Recent Labs  10/28/12 1225  10/31/12 0605  PROT 7.1  < > 7.6  ALBUMIN 3.6  < > 3.6  AST 428*  < > 203*  ALT 289*  < > 311*  ALKPHOS 89  < > 110  BILITOT 3.2*  < > 1.8*  BILIDIR 0.8*  --   --   IBILI 2.4*  --   --   < > = values in this interval not displayed.  Recent Labs  10/29/12 0600  CHOL 136   No results  found for this basename: PROTIME,  in the last 72 hours Telemetry: no adverse rhtyhms Personally viewed.   EKG:  NSR, biatrial abnormality, old anterolateral MI   Cardiac Studies:  ECHO 10/30/12:  - Left ventricle: The cavity size was mildly dilated. Wall thickness was normal. The estimated ejection fraction was 15%. Diffuse hypokinesis. Doppler parameters are consistent with restrictive physiology, indicative of decreased left ventricular diastolic compliance and/or increased left atrial pressure. - Mitral valve: Mild regurgitation. - Left atrium: The atrium was mildly dilated. - Right ventricle: The cavity size was mildly dilated. Systolic function was severely reduced. - Right atrium: The atrium was mildly dilated. - Pulmonary arteries: Systolic pressure was mildly increased. PA peak pressure: 35mm Hg (S). - Pericardium, extracardiac: A trivial pericardial effusion was identified.     Assessment/Plan:   ASSESSMENT:  1. Acute on chronic systolic heart failure, possibly precipitated by NSAID therapy 2-3 weeks ago. Basic etiology of CSHF is likely ischemic in setting of old anterior MI.Functional status class 3-4  2. Prior Myocardial infarction 2004, treated with stenting x 2  3.  Hypertension  4. Probable sleep apnea  5. Diabetes mellitus  6. Elevated LFT's (likely congestive hepatopathy)  PLAN:  1. Aggressive diuresis to improve symptoms. Continue with 40 mg IV q 8 hours. Watch creatinine closely  2. Optimize heart failure regimen after adequately diuresed. Check TSH 3. Given prior MI,  added long acting nitrates.  4. Once heart failure is compensated, he will need coronary angiography. ?Monday 5. Daily weight and I/O  6. Will heart failure service consult. I will arrange.  7. Will likely need AICD/?BiV device if EF remains low on optimal therapy.  8. Aspirin, statin therapy  9. Will need sleep study  Complex medical issues. Discussed with family.   Kassidy Dockendorf,  Dairon Procter 10/31/2012, 9:56 AM

## 2012-10-31 NOTE — Progress Notes (Signed)
Denies any issues.  Eyes are jaundiced.  Pleasant.  Significant other at bedside.

## 2012-11-01 DIAGNOSIS — I428 Other cardiomyopathies: Secondary | ICD-10-CM

## 2012-11-01 LAB — COMPREHENSIVE METABOLIC PANEL
ALT: 202 U/L — ABNORMAL HIGH (ref 0–53)
AST: 91 U/L — ABNORMAL HIGH (ref 0–37)
Albumin: 3 g/dL — ABNORMAL LOW (ref 3.5–5.2)
Alkaline Phosphatase: 96 U/L (ref 39–117)
Chloride: 96 mEq/L (ref 96–112)
Creatinine, Ser: 1.23 mg/dL (ref 0.50–1.35)
Potassium: 3.4 mEq/L — ABNORMAL LOW (ref 3.5–5.1)
Sodium: 134 mEq/L — ABNORMAL LOW (ref 135–145)
Total Bilirubin: 1.3 mg/dL — ABNORMAL HIGH (ref 0.3–1.2)
Total Protein: 6.3 g/dL (ref 6.0–8.3)

## 2012-11-01 LAB — GLUCOSE, CAPILLARY: Glucose-Capillary: 90 mg/dL (ref 70–99)

## 2012-11-01 MED ORDER — CARVEDILOL 12.5 MG PO TABS
12.5000 mg | ORAL_TABLET | Freq: Two times a day (BID) | ORAL | Status: DC
  Administered 2012-11-01 – 2012-11-02 (×2): 12.5 mg via ORAL
  Filled 2012-11-01 (×4): qty 1

## 2012-11-01 MED ORDER — POTASSIUM CHLORIDE CRYS ER 20 MEQ PO TBCR
20.0000 meq | EXTENDED_RELEASE_TABLET | Freq: Two times a day (BID) | ORAL | Status: DC
  Administered 2012-11-01 – 2012-11-02 (×4): 20 meq via ORAL
  Filled 2012-11-01 (×7): qty 1

## 2012-11-01 NOTE — Progress Notes (Signed)
SATURATION QUALIFICATIONS: (This note is used to comply with regulatory documentation for home oxygen)  Patient Saturations on Room Air at Rest = 100%  Patient Saturations on Room Air while Ambulating =94-96%  Patient Saturations on 0 Liters of oxygen while Ambulating =94-96%  Please briefly explain why patient needs home oxygen:NA

## 2012-11-01 NOTE — Progress Notes (Addendum)
Subjective: Pt reports subjective improvement of cough, SOB. He is doing well on lasix and is down 6lbs since yesterday. He has been sleeping much better the past two nights. Denies CP, dizziness.  Objective: Vital signs in last 24 hours: Filed Vitals:   10/31/12 1557 10/31/12 2116 10/31/12 2328 11/01/12 0556  BP: 129/84 135/79  120/86  Pulse: 74 70 72 62  Temp:  97.9 F (36.6 C)  98.1 F (36.7 C)  TempSrc:  Oral  Oral  Resp:  20 18 19   Height:      Weight:    112.2 kg (247 lb 5.7 oz)  SpO2:  92% 98% 93%  patient is on 2.5LPM O2 [place] this morning  Weight change: -2.8 kg (-6 lb 2.8 oz)  Intake/Output Summary (Last 24 hours) at 11/01/12 0909 Last data filed at 11/01/12 0705  Gross per 24 hour  Intake    480 ml  Output   4700 ml  Net  -4220 ml   Vitals reviewed. General: resting in bed, NAD HEENT: vision grossly intact, no scleral icterus Cardiac: RRR, no rubs, murmurs or gallops Pulm: clear to auscultation bilaterally, no wheezes, rales, or rhonchi Abd: soft, nontender, nondistended, BS present Ext: warm and well perfused, no pedal edema Neuro: alert and oriented X3, cranial nerves II-XII grossly intact, strength and sensation to light touch equal in bilateral upper and lower extremities  Lab Results: Basic Metabolic Panel:  Recent Labs Lab 10/28/12 1131 10/28/12 1225  10/31/12 0605 11/01/12 0426  NA 131*  --   < > 133* 134*  K 4.9  --   < > 3.9 3.4*  CL 95*  --   < > 94* 96  CO2 23  --   < > 25 28  GLUCOSE 151*  --   < > 96 125*  BUN 22  --   < > 26* 27*  CREATININE 1.14  --   < > 1.25 1.23  CALCIUM 8.8  --   < > 9.3 8.6  MG  --  2.1  --   --   --   PHOS  --  4.5  --   --   --   < > = values in this interval not displayed. Liver Function Tests:  Recent Labs Lab 10/31/12 0605 11/01/12 0426  AST 203* 91*  ALT 311* 202*  ALKPHOS 110 96  BILITOT 1.8* 1.3*  PROT 7.6 6.3  ALBUMIN 3.6 3.0*    Recent Labs Lab 10/28/12 1225  LIPASE 37    CBC:  Recent Labs Lab 10/26/12 1925 10/28/12 1131  WBC 9.6 9.3  HGB 13.4 13.6  HCT 38.4* 37.8*  MCV 80.5 80.3  PLT 254 257   Cardiac Enzymes:  Recent Labs Lab 10/26/12 2053 10/30/12 1040  TROPONINI <0.30 <0.30   BNP:  Recent Labs Lab 10/26/12 1927 10/28/12 1225  PROBNP 6306.0* 4543.0*   D-Dimer:  Recent Labs Lab 10/28/12 1225  DDIMER 2.36*   CBG:  Recent Labs Lab 10/29/12 1620 10/30/12 0549 10/30/12 1117 10/30/12 1626 10/31/12 0543 11/01/12 0715  GLUCAP 174* 114* 114* 130* 112* 90   Hemoglobin A1C:  Recent Labs Lab 10/29/12 0600  HGBA1C 6.2*   Fasting Lipid Panel:  Recent Labs Lab 10/29/12 0600  CHOL 136  HDL 15*  LDLCALC 105*  TRIG 79  CHOLHDL 9.1   Coagulation:  Recent Labs Lab 10/29/12 0600  LABPROT 18.5*  INR 1.59*    Micro Results: No results found for this or any  previous visit (from the past 240 hour(s)).  Studies/Results: 2D ECHO:  10/30/12: Study Conclusions  - Left ventricle: The cavity size was mildly dilated. Wall thickness was normal. The estimated ejection fraction was 15%. Diffuse hypokinesis. Doppler parameters are consistent with restrictive physiology, indicative of decreased left ventricular diastolic compliance and/or increased left atrial pressure. - Mitral valve: Mild regurgitation. - Left atrium: The atrium was mildly dilated. - Right ventricle: The cavity size was mildly dilated.Systolic function was severely reduced. - Right atrium: The atrium was mildly dilated. - Pulmonary arteries: Systolic pressure was mildly increased. PA peak pressure: 35mm Hg (S). - Pericardium, extracardiac: A trivial pericardial effusion was identified.  Medications: I have reviewed the patient's current medications. Scheduled Meds: . amoxicillin  1,000 mg Oral Q8H  . aspirin EC  81 mg Oral Daily  . atorvastatin  20 mg Oral q1800  . carvedilol  6.25 mg Oral BID WC  . doxycycline  100 mg Oral Q12H  . enoxaparin  (LOVENOX) injection  40 mg Subcutaneous Q24H  . furosemide  40 mg Intravenous Q8H  . isosorbide mononitrate  30 mg Oral Daily  . lisinopril  5 mg Oral Daily  . sodium chloride  3 mL Intravenous Q12H   Continuous Infusions:  PRN Meds:.albuterol, chlorpheniramine-HYDROcodone, diphenhydrAMINE, ipratropium  Assessment/Plan:  Acute on chronic systolic CHF with right sided HF:  On admission he was being treated for PNA and CXR showed possible bronchopneumonia.  Pt afebrile, VSS, no leukocytosis. He did desat in the ED to the 80s with ambulation, but as of 9/19, patient no longer requires supplemental O2 either at rest or with ambulation. Pt did not have known history of CHF, but Echo done 9/19 showed EF of 15% c/w systolic CHF as well as severely compromised function of R ventricle. His proBNP was elevated on admission at 4543 which was decreased from 6306 on 9/15, and pt did endorse some orthopnea. On exam however, he does not appear volume overloaded, no crackles on lung exam, no lower extremity edema. Cardiology was consulted and thought pt appearted volume overloaded as the pt c/o of continued SOB and orthopnea. Lasix 40 IVq8 and Imdur were started on 9/19. Pt will also likely needcoronary angiography once his CHF is compensated. Additionally, he may need AICD or BiV device if his EF does not improved with optimal therapy. The pt seems to be doing well since starting the diuresis.  - Lasix 40mg  IV q8h until euvolemic, per Cards - Imdur 30mg  po daily - ASA 81 - Lipitor 20mg  daily - Coreg 6.25mg  BID increased to 12.5mg  BID today per cards - continue lisinopril 5mg  daily - HF team consult per Cardiology - F/u with Cardiology, appreciate recommendations  #Possible bronchopneumonia: Seen on CXR, vs bronchitis. He was started on Rocephin in the ED which was changed to Doxy and Amoxicillin. On CXR, there was concern for possible pulmonary nodules that will need to be reevaluated by CT scan in  3-44mo. -doxycycline 100mg  PO BID x 5-7 days, started amoxicillin 1g TID (s/p Rocephin IV x 1 dose in ED)- Pt on day 5/7 abx therapy  -duonebs q4h prn shortness of breath -pulse ox  -incentive spirometry  -repeat CT chest in 3-6 months per rads recs due to pulmonary nodules  -tussionex prn cough  -consider PFTs as outpatient in 3-6 weeks  #HTN: Patient's BP elevated on admission. He has never been on any antihypertensives previously. Lisinopril and Coreg were started on admission. With the addition of the Lasix, his blood pressure  has been better controlled. Will uptitrate antihypertensives once the Lasix has been decreased.  - Lasix 40mg  IV q8h - Lisinopril 5 mg daily - Coreg 6.25mg  BID  #Long QTc: Pt had long QT (506) on admission and had been started on Levaquin on 9/15 for presumed CAP at urgent care center so this was promptly stopped. Will need to avoid QT prolonging medications.   #Previous diagnosis of DM: Apparently pt was diagnosed with diabetes at urgent care this week. However, HbA1c is 6.2%, so he is not considered a diabetic, as >6.5% is considered diabetic. He is overweight and at risk for development of diabetes in the future. He will definitely need outpatient follow up of his blood glucose.  - Heart healthy diet   #Elevated transaminases: Unclear etiology at this time. AST 428, ALT 289, GGT 141, total bili 3.2 on admission. LFTs have been improving since admission. Today LFTs: AST 91, ALT 202, Total bili 1.3 and last GGT 132 (not repeated today). Likely due to EtOH abuse, especially given close to 2:1 AST to ALT ratio and elevated GGT. Pt states he drinks only on weekends, will drink up to 2 six packs of beer by himself, last drink on ?Saturday 9/13. Denies any alcohol use during the weekdays. Patient was recently on levaquin, which can be hepatotoxic, so this should also be considered especially since transaminitis is improving in the short term after we stopped levaquin on 9/17.  Hepatitis panel was negative and HIV nonreactive. Possibly from liver congestion 2/2 CHF.  - follow CMP  #LLQ Musculoskeletal abdominal pain: LLQ pain is improving. Given his cough has also improved, this is likely due to recent coughing episodes w/ muscle strain. Lipase 37, no no concern for pancreatitis.   #CAD: Patient has h/o MI in 2004 in [place]. He has not had any chest pain, but did have one episode of dizziness overnight on 9/18, no SOB, diaphoresis, or CP with this episode. Troponin was negative x 2 and EKG without ischemic changes on admission. Lipid panel 9/18- Total cholesterol 136, TG 79, HDL 15, LDL 105  - Lipitor 20mg  daily started 9/18  - Coreg 6.25mg  BID, started 9/18  - Obtaining records from hospital in [place], [place]   #Tobacco abuse: Quit smoking 1 week ago, no nicotine patch needed/desired   #Presumed OSA: Pt admits to snoring and apnea at night. Does not endorse HA, but states he easily falls asleep during the day. OSA may be cause of RHF. Tried CPAP in the hospital, but it caused too much anxiety. He is willing to retry the CPAP. -will need outpatient sleep study   #DVT PPx: Lovenox, SCDs   Dispo: Disposition is deferred at this time, awaiting improvement of current medical problems.  Anticipated discharge in approximately 2-3 day(s).   The patient does have a current PCP Windell Hummingbird, MD) and does need an [facility] hospital follow-up appointment after discharge.  The patient does not have transportation limitations that hinder transportation to clinic appointments.  .Services Needed at time of discharge: Y = Yes, Blank = No PT:   OT:   RN:   Equipment:   Other:     LOS: 4 days   Windell Hummingbird, MD 11/01/2012, 9:09 AM

## 2012-11-01 NOTE — Progress Notes (Signed)
Tolerating being on room air without incident.

## 2012-11-01 NOTE — Progress Notes (Signed)
Subjective:  On room air.  Weight from 115 to 112kg 9 liter net out.  LFT's trending down.  Creat 1.23 stable. BUN 27 from 23.   Objective:  Vital Signs in the last 24 hours: Temp:  [97.9 F (36.6 C)-98.5 F (36.9 C)] 98.5 F (36.9 C) (09/21 1007) Pulse Rate:  [62-74] 68 (09/21 1007) Resp:  [18-20] 20 (09/21 1007) BP: (120-135)/(79-86) 125/81 mmHg (09/21 1007) SpO2:  [92 %-100 %] 98 % (09/21 1007) Weight:  [112.2 kg (247 lb 5.7 oz)] 112.2 kg (247 lb 5.7 oz) (09/21 0556)  Intake/Output from previous day: 09/20 0701 - 09/21 0700 In: 720 [P.O.:720] Out: 3800 [Urine:3800]   Physical Exam: General: Well developed, well nourished, in no acute distress. Head:  Normocephalic and atraumatic. Lungs: Clear to auscultation and percussion. Heart: Normal S1 and S2.  No murmur, rubs or gallops.  Abdomen: soft, non-tender, positive bowel sounds. Protuberant Extremities: No clubbing or cyanosis. No edema. Neurologic: Alert and oriented x 3.     Recent Labs  10/31/12 0605 11/01/12 0426  NA 133* 134*  K 3.9 3.4*  CL 94* 96  CO2 25 28  GLUCOSE 96 125*  BUN 26* 27*  CREATININE 1.25 1.23    Recent Labs  10/30/12 1040  TROPONINI <0.30   Hepatic Function Panel  Recent Labs  11/01/12 0426  PROT 6.3  ALBUMIN 3.0*  AST 91*  ALT 202*  ALKPHOS 96  BILITOT 1.3*   Cardiac Studies:  EF 15%  Assessment/Plan:  ASSESSMENT:  1. Acute on chronic systolic heart failure, possibly precipitated by NSAID therapy 2-3 weeks ago. Basic etiology of CSHF is likely ischemic in setting of old anterior MI.Functional status class 3-4  2. Prior Myocardial infarction 2004, treated with stenting x 2  3. Hypertension  4. Probable sleep apnea  5. Diabetes mellitus  6. Elevated LFT's (likely congestive hepatopathy)  PLAN:  1. Aggressive diuresis to improve symptoms. Continue with 40 mg IV q 8 hours. Watch creatinine closely. Continue today. BUN slightly increased.  2. Optimize heart failure  regimen after adequately diuresed.  TSH pending  3. Given prior MI, added long acting nitrates. Coreg can be increased to 12.5mg  BID.  4. Once heart failure is compensated, he will need coronary angiography. ?Monday. Just in case will make NPO.  5. Daily weight and I/O  6. Will get heart failure service consult. Dr. Katrinka Blazing to arrange. 7. Will likely need AICD/?BiV device if EF remains low on optimal therapy.  8. Aspirin, statin therapy  9. Will need sleep study  Complex medical issues. Discussed with family.      Devon [name], Devon 11/01/2012, 12:13 PM

## 2012-11-02 DIAGNOSIS — I5023 Acute on chronic systolic (congestive) heart failure: Principal | ICD-10-CM

## 2012-11-02 LAB — COMPREHENSIVE METABOLIC PANEL
ALT: 149 U/L — ABNORMAL HIGH (ref 0–53)
Albumin: 3 g/dL — ABNORMAL LOW (ref 3.5–5.2)
Alkaline Phosphatase: 110 U/L (ref 39–117)
Chloride: 99 mEq/L (ref 96–112)
GFR calc Af Amer: 80 mL/min — ABNORMAL LOW (ref >90)
Glucose, Bld: 112 mg/dL — ABNORMAL HIGH (ref 70–99)
Potassium: 3.8 mEq/L (ref 3.5–5.1)
Sodium: 136 mEq/L (ref 135–145)
Total Protein: 6.4 g/dL (ref 6.0–8.3)

## 2012-11-02 LAB — T4, FREE: Free T4: 1.16 ng/dL (ref 0.80–1.80)

## 2012-11-02 LAB — GLUCOSE, CAPILLARY: Glucose-Capillary: 111 mg/dL — ABNORMAL HIGH (ref 70–99)

## 2012-11-02 MED ORDER — FUROSEMIDE 40 MG PO TABS
40.0000 mg | ORAL_TABLET | Freq: Two times a day (BID) | ORAL | Status: DC
  Filled 2012-11-02 (×3): qty 1

## 2012-11-02 MED ORDER — SODIUM CHLORIDE 0.9 % IV SOLN: 250.0000 mL | INTRAVENOUS | Status: DC | PRN

## 2012-11-02 MED ORDER — SODIUM CHLORIDE 0.9 % IV SOLN
1.0000 mL/kg/h | INTRAVENOUS | Status: DC
  Administered 2012-11-02 – 2012-11-03 (×2): 1 mL/kg/h via INTRAVENOUS

## 2012-11-02 MED ORDER — CARVEDILOL 6.25 MG PO TABS
6.2500 mg | ORAL_TABLET | Freq: Two times a day (BID) | ORAL | Status: DC
  Administered 2012-11-02 – 2012-11-05 (×7): 6.25 mg via ORAL
  Filled 2012-11-02 (×11): qty 1

## 2012-11-02 MED ORDER — FUROSEMIDE 10 MG/ML IJ SOLN
40.0000 mg | Freq: Three times a day (TID) | INTRAMUSCULAR | Status: DC
  Administered 2012-11-02 (×3): 40 mg via INTRAVENOUS
  Filled 2012-11-02 (×3): qty 4

## 2012-11-02 MED ORDER — DIGOXIN 125 MCG PO TABS
0.1250 mg | ORAL_TABLET | Freq: Every day | ORAL | Status: DC
  Administered 2012-11-02 – 2012-11-05 (×3): 0.125 mg via ORAL
  Filled 2012-11-02 (×4): qty 1

## 2012-11-02 MED ORDER — LISINOPRIL 5 MG PO TABS
5.0000 mg | ORAL_TABLET | Freq: Two times a day (BID) | ORAL | Status: DC
  Administered 2012-11-02 (×2): 5 mg via ORAL
  Filled 2012-11-02 (×4): qty 1

## 2012-11-02 MED ORDER — SPIRONOLACTONE 25 MG PO TABS
25.0000 mg | ORAL_TABLET | Freq: Every day | ORAL | Status: DC
  Administered 2012-11-02: 11:00:00 25 mg via ORAL
  Filled 2012-11-02 (×2): qty 1

## 2012-11-02 MED ORDER — SODIUM CHLORIDE 0.9 % IJ SOLN: 3.0000 mL | INTRAMUSCULAR | Status: DC | PRN

## 2012-11-02 MED ORDER — SODIUM CHLORIDE 0.9 % IJ SOLN
3.0000 mL | Freq: Two times a day (BID) | INTRAMUSCULAR | Status: DC
  Administered 2012-11-02: 3 mL via INTRAVENOUS

## 2012-11-02 MED ORDER — ISOSORBIDE MONONITRATE ER 60 MG PO TB24
60.0000 mg | ORAL_TABLET | Freq: Every day | ORAL | Status: DC
  Filled 2012-11-02: qty 1

## 2012-11-02 NOTE — Consult Note (Signed)
Advanced Heart Failure Team Consult Note  Referring Physician: Dr Katrinka Blazing Primary Physician:  None Primary Cardiologist:  None  Reason for Consultation: Acute Systolic Heart Failure EF 15%   HPI:   The Heart Failure Team was asked to provide a consult at the request of Dr Robb Matar for severely reduced EF 15%.   Devon [name] is a 52 year old man with history of DM2 (diagnosed at urgent care last week), HTN (diagnosed at urgent care last week), ETOH, CAD s/p anterior MI 2004 [place] in 2004 with cath and stent x 2 placed at that time.  HIV negative. Snores a lot.   He has not seen a cardiologist in 4 years. 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 Says he drinks 6-12 beers per week. Quit smoking last week.   Prior to admit he was evaluated a few times in Urgent Care and treated for bronchitis/pneumonia. Last week he was evaluated in Urgent Care for increased dyspnea. He was sent from Urgent Care to [facility] ED. CT of chest- no pulmonary embolus and two nodular opacities in the left lower lobe. (F/U CT in 3 months). Initially treated with doxycycline and amoxicillin.  Cardiology consulted on 9/19 and he has been diuresed with IV lasix, started on carvedilol, lisinopril, and Imdur. Feeling better. Scheduled for cath tomorrow.   Pertininent admit labs: Pro BNP 6306  10/30/12 ECHO EF 15% with diffuse HK (LVIDd 61 mm) RV mildly dilated severely HK   Review of Systems: [y] = yes, [ ]  = no   General: Weight gain [Y ]; Weight loss [ ] ; Anorexia [ ] ; Fatigue [ Y]; Fever [ ] ; Chills [ ] ; Weakness [ ]   Cardiac: Chest pain/pressure [ ] ; Resting SOB [ ] ; Exertional SOB [Y ]; Orthopnea [ Y]; Pedal Edema [ ] ; Palpitations [ ] ; Syncope [ ] ; Presyncope [ ] ; Paroxysmal nocturnal dyspnea[Y ]  Pulmonary: Cough [Y ]; Wheezing[ ] ; Hemoptysis[ ] ; Sputum [ ] ; Snoring [ ]   GI: Vomiting[ ] ; Dysphagia[ ] ; Melena[ ] ; Hematochezia [ ] ; Heartburn[ ] ; Abdominal pain [ ] ; Constipation [ ] ; Diarrhea [ ] ; BRBPR [ ]   GU:  Hematuria[ ] ; Dysuria [ ] ; Nocturia[ ]   Vascular: Pain in legs with walking [ ] ; Pain in feet with lying flat [ ] ; Non-healing sores [ ] ; Stroke [ ] ; TIA [ ] ; Slurred speech [ ] ;  Neuro: Headaches[ ] ; Vertigo[ ] ; Seizures[ ] ; Paresthesias[ ] ;Blurred vision [ ] ; Diplopia [ ] ; Vision changes [ ]   Ortho/Skin: Arthritis [ ] ; Joint pain [ ] ; Muscle pain [ ] ; Joint swelling [ ] ; Back Pain [ ] ; Rash [ ]   Psych: Depression[ ] ; Anxiety[ ]   Heme: Bleeding problems [ ] ; Clotting disorders [ ] ; Anemia [ ]   Endocrine: Diabetes [Y ]; Thyroid dysfunction[ ]   Home Medications Prior to Admission medications   Medication Sig Start Date End Date Taking? Authorizing Provider  aspirin EC 81 MG tablet Take 81 mg by mouth daily.   Yes Historical Provider, MD  dextromethorphan-guaiFENesin (MUCINEX DM) 30-600 MG per 12 hr tablet Take 1 tablet by mouth every 12 (twelve) hours. 10/26/12  Yes Shelda Jakes, MD  levofloxacin (LEVAQUIN) 500 MG tablet Take 1.5 tablets (750 mg total) by mouth daily. 10/26/12  Yes Shelda Jakes, MD    Past Medical History: Past Medical History  Diagnosis Date  . Coronary artery disease   . Hypertension   . CHF (congestive heart failure)   . Diabetes mellitus without complication  Past Surgical History: Past Surgical History  Procedure Laterality Date  . Coronary stents      Family History: Family History  Problem Relation Age of Onset  . Hypertension Father     father, brother, sister  . Diabetes      sister  . Coronary artery disease Sister     sister  . Breast cancer Mother   . Breast cancer Sister     Social History: History   Social History  . Marital Status: Single    Spouse Name: N/A    Number of Children: N/A  . Years of Education: N/A   Social History Main Topics  . Smoking status: Current Every Day Smoker -- 0.25 packs/day    Types: Cigarettes  . Smokeless tobacco: None  . Alcohol Use: Yes  . Drug Use: No  . Sexual Activity: Yes    Other Topics Concern  . 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           Allergies:  No Known Allergies  Objective:    Vital Signs:   Temp:  [98.2 F (36.8 C)-98.5 F (36.9 C)] 98.3 F (36.8 C) (09/22 0439) Pulse Rate:  [60-72] 72 (09/22 0439) Resp:  [20] 20 (09/22 0439) BP: (117-132)/(60-82) 117/82 mmHg (09/22 0439) SpO2:  [97 %-100 %] 97 % (09/22 0439) Weight:  [244 lb 4.3 oz (110.8 kg)] 244 lb 4.3 oz (110.8 kg) (09/22 0439) Last BM Date: 10/31/12  Weight change: Filed Weights   10/31/12 0506 11/01/12 0556 11/02/12 0439  Weight: 253 lb 8.5 oz (115 kg) 247 lb 5.7 oz (112.2 kg) 244 lb 4.3 oz (110.8 kg)    Intake/Output:   Intake/Output Summary (Last 24 hours) at 11/02/12 0914 Last data filed at 11/02/12 0734  Gross per 24 hour  Intake      0 ml  Output   4875 ml  Net  -4875 ml     Physical Exam: General:  Well appearing. No resp difficulty HEENT: normal Neck: supple. JVP difficult to see . Carotids 2+ bilat; no bruits. No lymphadenopathy or thryomegaly appreciated. Cor: PMI nonpalpable. Very distant HS Regular rate & rhythm. No rubs, gallops. 2/6 MR Abdomen: soft, nontender, + distended. No hepatosplenomegaly. No bruits or masses. Good bowel sounds. Extremities: no cyanosis, clubbing, rash, edema Neuro: alert & orientedx3, cranial nerves grossly intact. moves all 4 extremities w/o difficulty. Affect pleasant  Telemetry:  SR  Labs: Basic Metabolic Panel:  Recent Labs Lab 10/28/12 1131 10/28/12 1225 10/29/12 0600 10/30/12 0450 10/31/12 0605 11/01/12 0426 11/02/12 0345  NA 131*  --  133* 135 133* 134* 136  K 4.9  --  4.2 4.8 3.9 3.4* 3.8  CL 95*  --  97 100 94* 96 99  CO2 23  --  25 24 25 28 29   GLUCOSE 151*  --  113* 115* 96 125* 112*  BUN 22  --  23 23 26* 27* 24*  CREATININE 1.14  --  1.13 1.08 1.25 1.23 1.19  CALCIUM 8.8  --  8.8 8.7 9.3 8.6 8.7  MG  --  2.1  --   --   --   --   --   PHOS  --  4.5  --   --   --   --    --     Liver Function Tests:  Recent Labs Lab 10/29/12 0600 10/30/12 0450 10/31/12 0605 11/01/12 0426 11/02/12 0345  AST 322* 201* 203* 91*  53*  ALT 316* 275* 311* 202* 149*  ALKPHOS 94 89 110 96 110  BILITOT 1.9* 1.7* 1.8* 1.3* 0.9  PROT 6.7 6.5 7.6 6.3 6.4  ALBUMIN 3.2* 3.2* 3.6 3.0* 3.0*    Recent Labs Lab 10/28/12 1225  LIPASE 37   No results found for this basename: AMMONIA,  in the last 168 hours  CBC:  Recent Labs Lab 10/26/12 1925 10/28/12 1131  WBC 9.6 9.3  HGB 13.4 13.6  HCT 38.4* 37.8*  MCV 80.5 80.3  PLT 254 257    Cardiac Enzymes:  Recent Labs Lab 10/26/12 2053 10/30/12 1040  TROPONINI <0.30 <0.30    BNP: BNP (last 3 results)  Recent Labs  10/26/12 1927 10/28/12 1225  PROBNP 6306.0* 4543.0*    CBG:  Recent Labs Lab 10/30/12 1117 10/30/12 1626 10/31/12 0543 11/01/12 0715 11/02/12 0606  GLUCAP 114* 130* 112* 90 111*    Coagulation Studies: No results found for this basename: LABPROT, INR,  in the last 72 hours  Other results: EKG: SR 84. Anterior Qs. IVCD (106 ms) TWI v5-v6 Imaging:  No results found.   Medications:     Current Medications: . amoxicillin  1,000 mg Oral Q8H  . aspirin EC  81 mg Oral Daily  . atorvastatin  20 mg Oral q1800  . carvedilol  12.5 mg Oral BID WC  . doxycycline  100 mg Oral Q12H  . enoxaparin (LOVENOX) injection  40 mg Subcutaneous Q24H  . furosemide  40 mg Oral BID  . isosorbide mononitrate  60 mg Oral Daily  . lisinopril  5 mg Oral Daily  . potassium chloride  20 mEq Oral BID  . sodium chloride  3 mL Intravenous Q12H     Infusions:      Assessment:   1. A/C Systolic Heart Failure EF 15% with biventricular HF 2. CAD- s/p anterior MI MI with stents 2004   3. HTN 4.  DM 5. Pulmonary nodules - repeat CT in 3 months 6. Former tobacco  7. ETOH use 8. OSA, refusing CPAP  Plan/Discussion:    Mr Wichert is a 52 year old with acute systolic heart failure now with an EF  15%.  Admitted NYHA III symptoms. Continue IV lasix and adjust after cardiac cath in am per Dr Katrinka Blazing. Cut back carvedilol 6.25 mg twice a day. Increase lisinopril 5 mg bid. Add digoxin 0.125 mg daily and 12.5 mg spirolactone daily.   Consult cardiac rehab and dietitian.    Length of Stay: 5  Devon [name],Devon [name] 11/02/2012, 9:14 AM Advanced Heart Failure Team Pager [phone] (M-F; 7a - 4p)  Please contact [facility] for night-coverage after hours (4p -7a ) and weekends on [email or web address]  Patient seen and examined with Tonye Becket, NP. We discussed all aspects of the encounter. I agree with the assessment and plan as stated above. 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  Agree with R & L cath tomorrow to help sort out.   He still seems to have volume on board so would continue diuresis. We have cut back on his b-blocker (and nitrates) in favor of titrating his afterload reduction and diuresis. Will also add digoxin. Long talk about his situation and need for compliance and close f/u as an outpatient in the HF clinic.   HF team will follow closely.   Devon Madarang,MD 11:20 AM

## 2012-11-02 NOTE — Progress Notes (Addendum)
Patient Name: Devon [name] Date of Encounter: 11/02/2012    SUBJECTIVE: The patient is still on IV Lasix. His breathing is markedly improved compared to Friday evening when I initially saw him. He has been ambulating in the hall without difficulty. Cough has significantly improved. Weight is down 14 pounds since admission appear  TELEMETRY:  Normal sinus rhythm. Filed Vitals:   11/01/12 1007 11/01/12 1714 11/01/12 2023 11/02/12 0439  BP: 125/81 132/60 118/78 117/82  Pulse: 68 72 60 72  Temp: 98.5 F (36.9 C)  98.2 F (36.8 C) 98.3 F (36.8 C)  TempSrc: Oral  Oral Oral  Resp: 20  20 20   Height:      Weight:    110.8 kg (244 lb 4.3 oz)  SpO2: 98%  100% 97%    Intake/Output Summary (Last 24 hours) at 11/02/12 0810 Last data filed at 11/02/12 0734  Gross per 24 hour  Intake    240 ml  Output   4875 ml  Net  -4635 ml    I/O (since admission): - 14 L  LABS: Basic Metabolic Panel:  Recent Labs  40/98/11 0426 11/02/12 0345  NA 134* 136  K 3.4* 3.8  CL 96 99  CO2 28 29  GLUCOSE 125* 112*  BUN 27* 24*  CREATININE 1.23 1.19  CALCIUM 8.6 8.7   BNP (last 3 results)  Recent Labs  10/26/12 1927 10/28/12 1225  PROBNP 6306.0* 4543.0*    Cardiac Enzymes:  Recent Labs  10/30/12 1040  TROPONINI <0.30     Radiology/Studies:  No new data  Physical Exam: Blood pressure 117/82, pulse 72, temperature 98.3 F (36.8 C), temperature source Oral, resp. rate 20, height 5\' 10"  (1.778 m), weight 110.8 kg (244 lb 4.3 oz), SpO2 97.00%. Weight change: -1.4 kg (-3 lb 1.4 oz)    chest is clear.  No obvious JVD is noted, but patient sitting at 90.  Unable to hear S3 gallop. This morning.  No peripheral edema.   ASSESSMENT:  1. Acute on chronic systolic heart failure, markedly improved with diuresis over the weekend.  2. Ischemic heart disease with prior anterior microinfarction approximately 10 years ago. No ischemic symptoms.  3. Diabetes mellitus.  4.  Hypertension, poorly controlled  Plan:  1. Switch diuretic therapy to oral. Hold on day of cath  2. Hold ACE inhibitor therapy in preparation for coronary angiogram possibly in AM if all agree..  3. Possible Coronary angiogram, discussed with the patient in detail, including probable radial approach. ? Right heart needed? Will defer to heart failure team recommendation.  4. Heart failure team consult obtained.  5. Further increase long-acting nitrate therapy.  Selinda Eon 11/02/2012, 8:10 AM

## 2012-11-02 NOTE — Progress Notes (Signed)
Pt does not wish to wear CPAP mask. Pt attempted to wear previous night and was not comfortable. Pt resting well and in no distress at this time. Family at bedside. Pt encouraged to call RT if pt changes mind.

## 2012-11-02 NOTE — Progress Notes (Signed)
CARDIAC REHAB PHASE I   PRE:  Rate/Rhythm: 62 SR    BP: sitting 110/70    SaO2: 97 RA  MODE:  Ambulation: 760 ft   POST:  Rate/Rhythm: 87 SR    BP: sitting 114/70     SaO2: 99 RA  Tolerated well, no c/o. Admits to SOB at times (in general). Vss walking. Began discussing CHF and gave book. Pt distracted at times during ed. His diagnosis seems overwhelming to him. Deflecting. Will continue to follow for ed. [id number]   Elissa Lovett [place] CES, ACSM 11/02/2012 2:37 PM

## 2012-11-02 NOTE — Progress Notes (Signed)
Pt was placed on Auto CPAP with a nasal mask. Pt is tolerating well at this time.  

## 2012-11-02 NOTE — Progress Notes (Signed)
Subjective: Patient feels well this morning with no SOB. He slept well. He is not using his CPAP machine. Denies symptoms of hypothyroidism such as cold intolerance, constipation, joint aches, dry skin. Does have intermittent dry mouth. Patient is tolerating diuresis well and is down 11lbs since his admission. HF team saw patient this AM and made multiple changes to his medication regimen. He will have R and L heart cath tomorrow AM.  Objective: Vital signs in last 24 hours: Filed Vitals:   11/01/12 1007 11/01/12 1714 11/01/12 2023 11/02/12 0439  BP: 125/81 132/60 118/78 117/82  Pulse: 68 72 60 72  Temp: 98.5 F (36.9 C)  98.2 F (36.8 C) 98.3 F (36.8 C)  TempSrc: Oral  Oral Oral  Resp: 20  20 20   Height:      Weight:    110.8 kg (244 lb 4.3 oz)  SpO2: 98%  100% 97%  on room air this morning  Weight change: -1.4 kg (-3 lb 1.4 oz)  Intake/Output Summary (Last 24 hours) at 11/02/12 1000 Last data filed at 11/02/12 0734  Gross per 24 hour  Intake      0 ml  Output   4575 ml  Net  -4575 ml   Vitals reviewed. General: sitting comfortably in chair, NAD HEENT: vision grossly intact, no scleral icterus; no thyroid enlargement Cardiac: RRR, no rubs, murmurs or gallops Pulm: clear to auscultation bilaterally, no wheezes, rales, or rhonchi Abd: soft, nontender, nondistended, BS present Ext: warm and well perfused, no pedal edema Neuro: alert and oriented X3, cranial nerves II-XII grossly intact, strength and sensation to light touch equal in bilateral upper and lower extremities  Lab Results: Basic Metabolic Panel:  Recent Labs Lab 10/28/12 1131 10/28/12 1225  11/01/12 0426 11/02/12 0345  NA 131*  --   < > 134* 136  K 4.9  --   < > 3.4* 3.8  CL 95*  --   < > 96 99  CO2 23  --   < > 28 29  GLUCOSE 151*  --   < > 125* 112*  BUN 22  --   < > 27* 24*  CREATININE 1.14  --   < > 1.23 1.19  CALCIUM 8.8  --   < > 8.6 8.7  MG  --  2.1  --   --   --   PHOS  --  4.5  --   --    --   < > = values in this interval not displayed. Liver Function Tests:  Recent Labs Lab 11/01/12 0426 11/02/12 0345  AST 91* 53*  ALT 202* 149*  ALKPHOS 96 110  BILITOT 1.3* 0.9  PROT 6.3 6.4  ALBUMIN 3.0* 3.0*    Recent Labs Lab 10/28/12 1225  LIPASE 37   CBC:  Recent Labs Lab 10/26/12 1925 10/28/12 1131  WBC 9.6 9.3  HGB 13.4 13.6  HCT 38.4* 37.8*  MCV 80.5 80.3  PLT 254 257   Cardiac Enzymes:  Recent Labs Lab 10/26/12 2053 10/30/12 1040  TROPONINI <0.30 <0.30   BNP:  Recent Labs Lab 10/26/12 1927 10/28/12 1225  PROBNP 6306.0* 4543.0*   D-Dimer:  Recent Labs Lab 10/28/12 1225  DDIMER 2.36*   CBG:  Recent Labs Lab 10/30/12 0549 10/30/12 1117 10/30/12 1626 10/31/12 0543 11/01/12 0715 11/02/12 0606  GLUCAP 114* 114* 130* 112* 90 111*   Hemoglobin A1C:  Recent Labs Lab 10/29/12 0600  HGBA1C 6.2*   Fasting Lipid Panel:  Recent  Labs Lab 10/29/12 0600  CHOL 136  HDL 15*  LDLCALC 105*  TRIG 79  CHOLHDL 9.1   Coagulation:  Recent Labs Lab 10/29/12 0600  LABPROT 18.5*  INR 1.59*   Studies/Results: 2D ECHO:  10/30/12: Study Conclusions  - Left ventricle: The cavity size was mildly dilated. Wall thickness was normal. The estimated ejection fraction was 15%. Diffuse hypokinesis. Doppler parameters are consistent with restrictive physiology, indicative of decreased left ventricular diastolic compliance and/or increased left atrial pressure. - Mitral valve: Mild regurgitation. - Left atrium: The atrium was mildly dilated. - Right ventricle: The cavity size was mildly dilated.Systolic function was severely reduced. - Right atrium: The atrium was mildly dilated. - Pulmonary arteries: Systolic pressure was mildly increased. PA peak pressure: 35mm Hg (S). - Pericardium, extracardiac: A trivial pericardial effusion was identified.  Medications: I have reviewed the patient's current medications. Scheduled Meds: . amoxicillin   1,000 mg Oral Q8H  . aspirin EC  81 mg Oral Daily  . atorvastatin  20 mg Oral q1800  . carvedilol  6.25 mg Oral BID WC  . digoxin  0.125 mg Oral Daily  . doxycycline  100 mg Oral Q12H  . enoxaparin (LOVENOX) injection  40 mg Subcutaneous Q24H  . furosemide  40 mg Intravenous TID  . lisinopril  5 mg Oral BID  . potassium chloride  20 mEq Oral BID  . sodium chloride  3 mL Intravenous Q12H  . spironolactone  25 mg Oral Daily   Continuous Infusions:  PRN Meds:.albuterol, chlorpheniramine-HYDROcodone, diphenhydrAMINE, ipratropium  Assessment/Plan:  Acute on chronic systolic CHF with right sided HF:   Pt does not have known history of CHF, but Echo done 9/19 showed EF of 15% c/w systolic CHF as well as severely compromised function of R ventricle. His proBNP was elevated on admission at 4543 which was decreased from 6306 on 9/15, and pt did endorse some orthopnea. On exam however, he has not appeared volume overloaded, no crackles on lung exam, no lower extremity edema. Cardiology was consulted and thought pt appearted volume overloaded as the pt c/o of continued SOB and orthopnea. Lasix 40 IVq8 and Imdur were started on 9/19. R and L heart cath planned for tomorrow AM. Additionally, he may need AICD or BiV device if his EF does not improved with optimal therapy. The pt seems to be doing well since starting the diuresis and is down 11 lbs.  - HF team adjusted patient's medications today: Imdur discontinued, continue Lasix 40mg  IV q8h, lisinopril increased to 5mg  BID (from daily), added spironolactone 25mg  daily, decreased coreg back to 6.25mg  BID, added digoxin .125mg  daily - cardiac rehab - nutrition consult - ASA 81 - Lipitor 20mg  daily  - R and L heart cath tomorrow AM  #Possible bronchopneumonia: On admission, patient had been previously started on levaquin for presumed PNA by urgent care.  His CXR showed possible bronchopneumonia vs fluid overload. Pt afebrile, VSS, no leukocytosis. He had  desatted in the ED to the 80s with ambulation, but as of 9/19, patient no longer requires supplemental O2 either at rest or with ambulation. Breathing is much improved now. He was started on Rocephin in the ED which was changed to Doxy and Amoxicillin, will complete 5 days (today is last day). On chest CT, there was concern for possible pulmonary nodules that will need to be reevaluated by repeat  CT scan in 3-41mo. -doxycycline 100mg  PO BID x 5-7 days, started amoxicillin 1g TID (s/p Rocephin IV x 1 dose  in ED)- Pt on day 5/5 abx therapy- will discontinue tomorrow AM -duonebs q4h prn shortness of breath -pulse ox  -incentive spirometry  -repeat CT chest in 3-6 months per rads recs due to pulmonary nodules  -tussionex prn cough  -consider PFTs as outpatient in 3-6 weeks  #HTN: Patient's BP elevated on admission. He has never been on any antihypertensives previously. Lisinopril and Coreg were started on admission. With the addition of the Lasix, his blood pressure has been better controlled. - Lasix 40mg  IV q8h - Lisinopril 5 mg BID (increased from daily dosing on 9/22) - Coreg 6.25mg  BID  # Elevated TSH:  Patient with TSH of 6.69 without overt symptoms of hypothyroidism. Will check a free T4 level. If T4 is low, we will plan to treat. If T4 is normal, we will not treat given patient is asymptomatic.  -f/u Free T4 level  #Long QTc: Pt had long QT (506) on admission and had been started on Levaquin on 9/15 for presumed CAP at urgent care center so this was promptly stopped. Will need to avoid QT prolonging medications.   #Previous diagnosis of DM: Apparently pt was diagnosed with diabetes at urgent care last week. However, HbA1c is 6.2%, so he is not considered a diabetic, as >6.5% is considered diabetic. He is overweight and at risk for development of diabetes in the future. He will definitely need outpatient follow up of his blood glucose.  - Heart healthy diet  - diabetes coordinator saw patient  last week and patient is interested in making lifestyle changes  #Elevated transaminases: Unclear etiology at this time. AST 428, ALT 289, GGT 141, total bili 3.2 on admission. LFTs have been improving since admission. Today LFTs: AST 53, ALT 149, Total bili .9 and last GGT 132 (not repeated today). Most likely due to hepatic congestion 2/2 A/C CHF. EtOH abuse may be contributing, especially given close to 2:1 AST to ALT ratio and elevated GGT. Pt states he drinks only on weekends, will drink up to 2 six packs of beer by himself, last drink on ?Saturday 9/13. Denies any alcohol use during the weekdays. Patient was recently on levaquin, which can be hepatotoxic, so this should also be considered especially since transaminitis is improving in the short term after we stopped levaquin on 9/17. Hepatitis panel was negative and HIV nonreactive. - follow CMP  #LLQ Musculoskeletal abdominal pain: Resolved. Given his cough has also improved, this is likely due to recent coughing episodes w/ muscle strain. Lipase 37, no no concern for pancreatitis.   #CAD: Patient has h/o MI in 2004 in [place]. He has not had any chest pain, but did have one episode of dizziness overnight on 9/18, no SOB, diaphoresis, or CP with this episode. Troponin was negative x 2 and EKG without ischemic changes on admission. Lipid panel 9/18- Total cholesterol 136, TG 79, HDL 15, LDL 105  - Lipitor 20mg  daily started 9/18  - Coreg 6.25mg  BID, started 9/18  - Obtained rcords from hospital in [place], [place]   #Tobacco abuse: Quit smoking 1 week ago, no nicotine patch needed/desired   #Presumed OSA: Pt admits to snoring and apnea at night. Does not endorse HA, but states he easily falls asleep during the day. OSA may be cause of RHF. Tried CPAP in the hospital, but it caused too much anxiety. He is willing to retry the CPAP. -will need outpatient sleep study   #DVT PPx: Lovenox, SCDs   Dispo: Disposition is deferred at this time,  awaiting improvement of current medical problems.  Anticipated discharge in approximately 2-3 day(s).   The patient does have a current PCP Windell Hummingbird, MD) and does need an [facility] hospital follow-up appointment after discharge.  The patient does not have transportation limitations that hinder transportation to clinic appointments.  .Services Needed at time of discharge: Y = Yes, Blank = No PT:   OT:   RN:   Equipment:   Other:     LOS: 5 days   Windell Hummingbird, MD 11/02/2012, 10:00 AM

## 2012-11-03 ENCOUNTER — Encounter (HOSPITAL_COMMUNITY)
Admission: EM | Disposition: A | Discharge status: Home / Self Care | Payer: BC Managed Care – PPO | Source: Home / Self Care | Attending: Internal Medicine

## 2012-11-03 DIAGNOSIS — E038 Other specified hypothyroidism: Secondary | ICD-10-CM | POA: Diagnosis present

## 2012-11-03 DIAGNOSIS — I2510 Atherosclerotic heart disease of native coronary artery without angina pectoris: Secondary | ICD-10-CM

## 2012-11-03 DIAGNOSIS — E039 Hypothyroidism, unspecified: Secondary | ICD-10-CM | POA: Diagnosis present

## 2012-11-03 HISTORY — PX: LEFT HEART CATHETERIZATION WITH CORONARY ANGIOGRAM: SHX5451

## 2012-11-03 LAB — BASIC METABOLIC PANEL
BUN: 24 mg/dL — ABNORMAL HIGH (ref 6–23)
Calcium: 8.8 mg/dL (ref 8.4–10.5)
Chloride: 99 mEq/L (ref 96–112)
Creatinine, Ser: 1.38 mg/dL — ABNORMAL HIGH (ref 0.50–1.35)
GFR calc Af Amer: 66 mL/min — ABNORMAL LOW (ref >90)
GFR calc non Af Amer: 57 mL/min — ABNORMAL LOW (ref >90)
Glucose, Bld: 101 mg/dL — ABNORMAL HIGH (ref 70–99)
Potassium: 5.3 mEq/L — ABNORMAL HIGH (ref 3.5–5.1)

## 2012-11-03 LAB — POCT I-STAT 3, VENOUS BLOOD GAS (G3P V)
O2 Saturation: 54 %
TCO2: 28 mmol/L (ref 0–100)

## 2012-11-03 LAB — POCT I-STAT 3, ART BLOOD GAS (G3+)
O2 Saturation: 91 %
TCO2: 27 mmol/L (ref 0–100)
pCO2 arterial: 47.9 mmHg — ABNORMAL HIGH (ref 35.0–45.0)
pO2, Arterial: 65 mmHg — ABNORMAL LOW (ref 80.0–100.0)

## 2012-11-03 LAB — POCT ACTIVATED CLOTTING TIME: Activated Clotting Time: 329 seconds

## 2012-11-03 LAB — GLUCOSE, CAPILLARY
Glucose-Capillary: 103 mg/dL — ABNORMAL HIGH (ref 70–99)
Glucose-Capillary: 122 mg/dL — ABNORMAL HIGH (ref 70–99)
Glucose-Capillary: 120 mg/dL — ABNORMAL HIGH (ref 70–99)

## 2012-11-03 SURGERY — LEFT HEART CATHETERIZATION WITH CORONARY ANGIOGRAM
Anesthesia: LOCAL

## 2012-11-03 MED ORDER — FENTANYL CITRATE 0.05 MG/ML IJ SOLN
50.0000 ug | INTRAMUSCULAR | Status: DC | PRN
  Administered 2012-11-03 (×3): 50 ug via INTRAVENOUS
  Filled 2012-11-03 (×3): qty 2

## 2012-11-03 MED ORDER — LIDOCAINE HCL (PF) 1 % IJ SOLN
INTRAMUSCULAR | Status: AC
  Filled 2012-11-03: qty 30

## 2012-11-03 MED ORDER — SODIUM CHLORIDE 0.9 % IV SOLN
0.2500 mg/kg/h | INTRAVENOUS | Status: AC
  Administered 2012-11-03: 0.25 mg/kg/h via INTRAVENOUS
  Filled 2012-11-03: qty 250

## 2012-11-03 MED ORDER — FUROSEMIDE 10 MG/ML IJ SOLN
40.0000 mg | Freq: Two times a day (BID) | INTRAMUSCULAR | Status: DC
  Administered 2012-11-03 – 2012-11-05 (×4): 40 mg via INTRAVENOUS
  Filled 2012-11-03 (×4): qty 4

## 2012-11-03 MED ORDER — TICAGRELOR 90 MG PO TABS
90.0000 mg | ORAL_TABLET | Freq: Two times a day (BID) | ORAL | Status: DC
  Administered 2012-11-03 – 2012-11-05 (×4): 90 mg via ORAL
  Filled 2012-11-03 (×5): qty 1

## 2012-11-03 MED ORDER — FENTANYL CITRATE 0.05 MG/ML IJ SOLN
INTRAMUSCULAR | Status: AC
  Filled 2012-11-03: qty 2

## 2012-11-03 MED ORDER — NITROGLYCERIN IN D5W 200-5 MCG/ML-% IV SOLN
2.0000 ug/min | INTRAVENOUS | Status: DC
  Administered 2012-11-03: 10 ug/min via INTRAVENOUS

## 2012-11-03 MED ORDER — TICAGRELOR 90 MG PO TABS
ORAL_TABLET | ORAL | Status: AC
  Filled 2012-11-03: qty 2

## 2012-11-03 MED ORDER — ASPIRIN 81 MG PO CHEW
CHEWABLE_TABLET | ORAL | Status: AC
  Filled 2012-11-03: qty 4

## 2012-11-03 MED ORDER — HYDROCOD POLST-CHLORPHEN POLST 10-8 MG/5ML PO LQCR
5.0000 mL | Freq: Two times a day (BID) | ORAL | Status: DC | PRN
  Administered 2012-11-03 – 2012-11-04 (×2): 5 mL via ORAL
  Filled 2012-11-03 (×2): qty 5

## 2012-11-03 MED ORDER — ATROPINE SULFATE 0.1 MG/ML IJ SOLN
INTRAMUSCULAR | Status: AC
  Filled 2012-11-03: qty 10

## 2012-11-03 MED ORDER — SODIUM CHLORIDE 0.9 % IV SOLN
0.2500 mg/kg/h | INTRAVENOUS | Status: DC
  Filled 2012-11-03: qty 250

## 2012-11-03 MED ORDER — NITROGLYCERIN IN D5W 200-5 MCG/ML-% IV SOLN
INTRAVENOUS | Status: AC
  Filled 2012-11-03: qty 250

## 2012-11-03 MED ORDER — MIDAZOLAM HCL 2 MG/2ML IJ SOLN
INTRAMUSCULAR | Status: AC
  Filled 2012-11-03: qty 2

## 2012-11-03 MED ORDER — ACETAMINOPHEN 325 MG PO TABS: 650.0000 mg | ORAL_TABLET | ORAL | Status: DC | PRN

## 2012-11-03 MED ORDER — HEPARIN (PORCINE) IN NACL 2-0.9 UNIT/ML-% IJ SOLN
INTRAMUSCULAR | Status: AC
  Filled 2012-11-03: qty 1000

## 2012-11-03 MED ORDER — BIVALIRUDIN 250 MG IV SOLR
INTRAVENOUS | Status: AC
  Filled 2012-11-03: qty 250

## 2012-11-03 MED ORDER — FUROSEMIDE 10 MG/ML IJ SOLN
40.0000 mg | Freq: Once | INTRAMUSCULAR | Status: AC
  Administered 2012-11-03: 40 mg via INTRAVENOUS

## 2012-11-03 MED ORDER — NITROGLYCERIN 0.2 MG/ML ON CALL CATH LAB
INTRAVENOUS | Status: AC
  Filled 2012-11-03: qty 1

## 2012-11-03 MED ORDER — SODIUM CHLORIDE 0.9 % IV SOLN
1.0000 mL/kg/h | INTRAVENOUS | Status: DC
  Administered 2012-11-03: 0.593 mL/kg/h via INTRAVENOUS

## 2012-11-03 MED ORDER — ONDANSETRON HCL 4 MG/2ML IJ SOLN: 4.0000 mg | Freq: Four times a day (QID) | INTRAMUSCULAR | Status: DC | PRN

## 2012-11-03 NOTE — Progress Notes (Signed)
Advanced Heart Failure Rounding Note   Subjective:    [name] is a 52 year old man with history of DM2 (diagnosed at urgent care last week), HTN (diagnosed at urgent care last week), ETOH, CAD s/p anterior MI 2004 [place] in 2004 with cath and stent x 2 placed at that time. HIV negative. Snores a lot.   Prior to admit he was evaluated a few times in Urgent Care and treated for bronchitis/pneumonia. Last week he was evaluated in Urgent Care for increased dyspnea. He was sent from Urgent Care to [facility] ED. CT of chest- no pulmonary embolus and two nodular opacities in the left lower lobe. (F/U CT in 3 months). Initially treated with doxycycline and amoxicillin. Cardiology consulted on 9/19 and he has been diuresed with IV lasix, started on carvedilol, lisinopril, and Imdur.   10/30/12 ECHO EF 15% with diffuse HK (LVIDd 61 mm) RV mildly dilated severely   Yesterday he was started on digoxin, spironolactone, and lisinopril was increased. Weight down 3 pounds. K3.8>  5.3 Creatinine 1.19>1.3.  R & LHC today. S/p PCI/stenting of LCX with embolization to OM-1  RA 12 RV 51/7 PA 50/25 PCWP 23 LVEDP 21 Fick 4.0 Thermo 4.4  Post cath getting 100cc/hr due to dye load. Mild dyspnea. No CP   Objective:   Weight Range:  Vital Signs:   Temp:  [97.6 F (36.4 C)-98.6 F (37 C)] 98.6 F (37 C) (09/23 1230) Pulse Rate:  [35-78] 76 (09/23 1430) Resp:  [0-32] 27 (09/23 1430) BP: (125-158)/(80-118) 128/102 mmHg (09/23 1400) SpO2:  [95 %-100 %] 99 % (09/23 1430) Arterial Line BP: (143-179)/(81-110) 157/93 mmHg (09/23 1430) Weight:  [109.6 kg (241 lb 10 oz)-110 kg (242 lb 8.1 oz)] 110 kg (242 lb 8.1 oz) (09/23 1015) Last BM Date: 11/02/12  Weight change: Filed Weights   11/01/12 0556 11/02/12 0439 11/03/12 0525  Weight: 247 lb 5.7 oz (112.2 kg) 244 lb 4.3 oz (110.8 kg) 241 lb 10 oz (109.6 kg)    Intake/Output:   Intake/Output Summary (Last 24 hours) at 11/03/12 1030 Last data filed at  11/03/12 0700  Gross per 24 hour  Intake 1974.48 ml  Output   3125 ml  Net -1150.52 ml     Physical Exam: General:  Well appearing. No resp difficulty HEENT: normal Neck: supple. JVP to jaw . Carotids 2+ bilat; no bruits. No lymphadenopathy or thryomegaly appreciated. Cor: PMI nondisplaced. Regular rate & rhythm. No rubs, gallops or murmurs. Lungs: clear Abdomen: soft, nontender, nondistended. No hepatosplenomegaly. No bruits or masses. Good bowel sounds. Extremities: no cyanosis, clubbing, rash, edema Neuro: alert & orientedx3, cranial nerves grossly intact. moves all 4 extremities w/o difficulty. Affect pleasant  Telemetry:  SR 70s  Labs: Basic Metabolic Panel:  Recent Labs Lab 10/28/12 1131 10/28/12 1225  10/30/12 0450 10/31/12 0605 11/01/12 0426 11/02/12 0345 11/03/12 0500  NA 131*  --   < > 135 133* 134* 136 136  K 4.9  --   < > 4.8 3.9 3.4* 3.8 5.3*  CL 95*  --   < > 100 94* 96 99 99  CO2 23  --   < > 24 25 28 29 28   GLUCOSE 151*  --   < > 115* 96 125* 112* 101*  BUN 22  --   < > 23 26* 27* 24* 24*  CREATININE 1.14  --   < > 1.08 1.25 1.23 1.19 1.38*  CALCIUM 8.8  --   < > 8.7  9.3 8.6 8.7 8.8  MG  --  2.1  --   --   --   --   --   --   PHOS  --  4.5  --   --   --   --   --   --   < > = values in this interval not displayed.  Liver Function Tests:  Recent Labs Lab 10/29/12 0600 10/30/12 0450 10/31/12 0605 11/01/12 0426 11/02/12 0345  AST 322* 201* 203* 91* 53*  ALT 316* 275* 311* 202* 149*  ALKPHOS 94 89 110 96 110  BILITOT 1.9* 1.7* 1.8* 1.3* 0.9  PROT 6.7 6.5 7.6 6.3 6.4  ALBUMIN 3.2* 3.2* 3.6 3.0* 3.0*    Recent Labs Lab 10/28/12 1225  LIPASE 37   No results found for this basename: AMMONIA,  in the last 168 hours  CBC:  Recent Labs Lab 10/28/12 1131  WBC 9.3  HGB 13.6  HCT 37.8*  MCV 80.3  PLT 257    Cardiac Enzymes:  Recent Labs Lab 10/30/12 1040  TROPONINI <0.30    BNP: BNP (last 3 results)  Recent Labs   10/26/12 1927 10/28/12 1225  PROBNP 6306.0* 4543.0*     Other results:   Imaging:  No results found.   Medications:     Scheduled Medications: . aspirin EC  81 mg Oral Daily  . atorvastatin  20 mg Oral q1800  . carvedilol  6.25 mg Oral BID WC  . digoxin  0.125 mg Oral Daily  . enoxaparin (LOVENOX) injection  40 mg Subcutaneous Q24H  . sodium chloride  3 mL Intravenous Q12H     Infusions: . sodium chloride 1 mL/kg/hr (11/03/12 0955)  . bivalirudin (ANGIOMAX) infusion 5 mg/mL (Cath Lab,ACS,PCI indication)    . nitroGLYCERIN 10 mcg/min (11/03/12 0954)     PRN Medications:  albuterol, chlorpheniramine-HYDROcodone, diphenhydrAMINE, fentaNYL, ipratropium   Assessment:   1. A/C Systolic Heart Failure EF 15% with biventricular HF  2. CAD- s/p anterior MI MI with stents 2004  3. HTN  4. DM  5. Pulmonary nodules - repeat CT in 3 months  6. Former tobacco  7. ETOH use  8. OSA, refusing CPAP   CLEGG,AMY, NP   Plan/Discussion:    Patient seen and examined with Tonye Becket, NP. We discussed all aspects of the encounter. I agree with the assessment and plan as stated above.   Results of cath noted. Cardiac output looks good. Filling pressures still mildly elevated and likely worse now with volume loading post cath.  Will start lasix 40 iv bid. Will continue to titrate HF meds prior to d/c.   Based on results of cath, I suspect CM is non-ischemic. (likely ETOH)  Truman Hayward 2:42 PM Length of Stay: 6 11/03/2012, 10:30 AM Advanced Heart Failure Team Pager [phone] (M-F; 7a - 4p)  Please contact [facility] for night-coverage after hours (4p -7a ) and weekends on [email or web address]

## 2012-11-03 NOTE — Progress Notes (Signed)
Subjective: Patient received his heart cath this morning and when I saw him he felt drowsy and c/o of some mild midsternal CP, no SOB. Cardiology is guiding his post-op care at this point.   Objective: Vital signs in last 24 hours: Filed Vitals:   11/03/12 0737 11/03/12 1015 11/03/12 1030 11/03/12 1045  BP:  144/118 147/114   Pulse: 59 61 62 61  Temp:      TempSrc:      Resp:   19 32  Height:  5\' 10"  (1.778 m)    Weight:  110 kg (242 lb 8.1 oz)    SpO2:  98% 99% 95%  on 2LPM O2 [place]  Weight change: -1.2 kg (-2 lb 10.3 oz)  Intake/Output Summary (Last 24 hours) at 11/03/12 1103 Last data filed at 11/03/12 0700  Gross per 24 hour  Intake 1974.48 ml  Output   3125 ml  Net -1150.52 ml   Vitals reviewed. General: lying in bed comfortably, appears drowsy, but is arousable HEENT: vision grossly intact Cardiac: RRR, no rubs, murmurs or gallops Pulm: anterior lung fields clear to auscultation bilaterally, no wheezes, rales, or rhonchi Abd: soft, nontender, nondistended, BS present Ext: warm and well perfused, no pedal edema Neuro: alert and oriented X3, cranial nerves II-XII grossly intact, moving all extremities spontaneously and without difficulty; sensation grossly intact  Lab Results: Basic Metabolic Panel:  Recent Labs Lab 10/28/12 1131 10/28/12 1225  11/02/12 0345 11/03/12 0500  NA 131*  --   < > 136 136  K 4.9  --   < > 3.8 5.3*  CL 95*  --   < > 99 99  CO2 23  --   < > 29 28  GLUCOSE 151*  --   < > 112* 101*  BUN 22  --   < > 24* 24*  CREATININE 1.14  --   < > 1.19 1.38*  CALCIUM 8.8  --   < > 8.7 8.8  MG  --  2.1  --   --   --   PHOS  --  4.5  --   --   --   < > = values in this interval not displayed. Liver Function Tests:  Recent Labs Lab 11/01/12 0426 11/02/12 0345  AST 91* 53*  ALT 202* 149*  ALKPHOS 96 110  BILITOT 1.3* 0.9  PROT 6.3 6.4  ALBUMIN 3.0* 3.0*    Recent Labs Lab 10/28/12 1225  LIPASE 37   CBC:  Recent Labs Lab  10/28/12 1131  WBC 9.3  HGB 13.6  HCT 37.8*  MCV 80.3  PLT 257   Cardiac Enzymes:  Recent Labs Lab 10/30/12 1040  TROPONINI <0.30   BNP:  Recent Labs Lab 10/28/12 1225  PROBNP 4543.0*   D-Dimer:  Recent Labs Lab 10/28/12 1225  DDIMER 2.36*   CBG:  Recent Labs Lab 10/30/12 1626 10/31/12 0543 11/01/12 0715 11/02/12 0606 11/03/12 0715 11/03/12 0957  GLUCAP 130* 112* 90 111* 103* 122*   Hemoglobin A1C:  Recent Labs Lab 10/29/12 0600  HGBA1C 6.2*   Fasting Lipid Panel:  Recent Labs Lab 10/29/12 0600  CHOL 136  HDL 15*  LDLCALC 105*  TRIG 79  CHOLHDL 9.1   Coagulation:  Recent Labs Lab 10/29/12 0600  LABPROT 18.5*  INR 1.59*   Studies/Results: 2D ECHO:  10/30/12: Study Conclusions  - Left ventricle: The cavity size was mildly dilated. Wall thickness was normal. The estimated ejection fraction was 15%. Diffuse hypokinesis. Doppler  parameters are consistent with restrictive physiology, indicative of decreased left ventricular diastolic compliance and/or increased left atrial pressure. - Mitral valve: Mild regurgitation. - Left atrium: The atrium was mildly dilated. - Right ventricle: The cavity size was mildly dilated.Systolic function was severely reduced. - Right atrium: The atrium was mildly dilated. - Pulmonary arteries: Systolic pressure was mildly increased. PA peak pressure: 35mm Hg (S). - Pericardium, extracardiac: A trivial pericardial effusion was identified.  Medications: I have reviewed the patient's current medications. Scheduled Meds: . aspirin EC  81 mg Oral Daily  . atorvastatin  20 mg Oral q1800  . carvedilol  6.25 mg Oral BID WC  . digoxin  0.125 mg Oral Daily  . Ticagrelor  90 mg Oral BID   Continuous Infusions: . sodium chloride 1 mL/kg/hr (11/03/12 0955)  . bivalirudin (ANGIOMAX) infusion 5 mg/mL (Cath Lab,ACS,PCI indication) 0.25 mg/kg/hr (11/03/12 1000)  . nitroGLYCERIN 10 mcg/min (11/03/12 0954)   PRN  Meds:.acetaminophen, albuterol, fentaNYL, ipratropium, ondansetron (ZOFRAN) IV  Assessment/Plan:  Acute on chronic systolic CHF with right sided HF:   Pt does not have known history of CHF, but Echo done 9/19 showed EF of 15% c/w systolic CHF as well as severely compromised function of R ventricle. His proBNP was elevated on admission at 4543 which was decreased from 6306 on 9/15, and pt did endorse some orthopnea. On exam however, he has not appeared volume overloaded, no crackles on lung exam, no lower extremity edema. Cardiology was consulted and thought pt appearted volume overloaded as the pt c/o of continued SOB and orthopnea. Patient received heart cath today and is doing well thus far. Patient may need AICD or BiV device eventually if his EF does not improved with optimal therapy. The pt seems to be doing well since starting the diuresis and is down 13 lbs since admission. - HF team is guiding medication management for his heart failure at this point: Imdur discontinued, continue Lasix 40mg  IV q8h, lisinopril increased to 5mg  BID (from daily), added spironolactone 25mg  daily, decreased coreg back to 6.25mg  BID, added digoxin .125mg  daily (diuretics and lisinopril were held for procedure today) - start bilvalirudin, brilinta, and NTG drip as per cardiology - cardiac rehab - nutrition consult - ASA 81 - Lipitor 20mg  daily  -K was elevated at 5.5 this morning after receiving supplementation and with addition of spironolactone, Kdur was discontinued  #Possible bronchopneumonia: On admission, patient had been previously started on levaquin for presumed PNA by urgent care.  His CXR showed possible bronchopneumonia vs fluid overload. Pt afebrile, VSS, no leukocytosis. He had desatted in the ED to the 80s with ambulation, but as of 9/19, patient no longer requires supplemental O2 either at rest or with ambulation. Breathing is much improved now. He was started on Rocephin in the ED and completed 5 day  course ofDoxy and Amoxicillin.  On chest CT, there was concern for possible pulmonary nodules that will need to be reevaluated by repeat  CT scan in 3-80mo. -Discontinued doxycycline 100mg  PO BID x 5-7 days and amoxicillin 1g TID (s/p Rocephin IV x 1 dose in ED)-  -duonebs q4h prn shortness of breath -pulse ox  -incentive spirometry  -repeat CT chest in 3-6 months per rads recs due to pulmonary nodules  -tussionex prn cough  -consider PFTs as outpatient in 3-6 weeks  #HTN: Patient's BP elevated on admission. He has never been on any antihypertensives previously. Lisinopril and Coreg were started on admission. With the addition of the Lasix, his blood  pressure has been better controlled. - Lasix 40mg  IV q8h (will adjust per cards) - Lisinopril 5 mg BID (increased from daily dosing on 9/22) - Coreg 6.25mg  BID  # Subclinical hypothyroidism:  Patient with TSH of 6.69, Free T4 wnl. We will not treat given patient is asymptomatic and free T4 is in normal range.  #Long QTc: Resolved. Pt had long QT (506) on admission and had been started on Levaquin on 9/15 for presumed CAP at urgent care center so this was promptly stopped. This has resolved with QTc 461 on EKG 9/23.    #Previous diagnosis of DM: Apparently pt was diagnosed with diabetes at urgent care last week. However, HbA1c is 6.2%, so he is not considered a diabetic, as >6.5% is considered diabetic. He is overweight and at risk for development of diabetes in the future. He will definitely need outpatient follow up of his blood glucose.  - Heart healthy diet  - diabetes coordinator saw patient last week and patient is interested in making lifestyle changes  #Elevated transaminases: Unclear etiology at this time. AST 428, ALT 289, GGT 141, total bili 3.2 on admission. LFTs have been improving since admission. LFTs 9/22: AST 53, ALT 149, Total bili .9 and last GGT 132 (not repeated today). Most likely due to hepatic congestion 2/2 A/C CHF given it  is improving with diuresis. EtOH abuse may be contributing, especially given close to 2:1 AST to ALT ratio and elevated GGT. Pt states he drinks only on weekends, will drink up to 2 six packs of beer by himself, last drink on ?Saturday 9/13. Denies any alcohol use during the weekdays. Patient was recently on levaquin, which can be hepatotoxic, so this should also be considered especially since transaminitis is improving in the short term after we stopped levaquin on 9/17. Hepatitis panel was negative and HIV nonreactive. - follow CMP intermittently  #LLQ Musculoskeletal abdominal pain: Resolved. Given his cough has also improved, this is likely due to recent coughing episodes w/ muscle strain. Lipase 37, no no concern for pancreatitis.   #CAD: Patient has h/o MI in 2004 in [place]. He has not had any chest pain, but did have one episode of dizziness overnight on 9/18, no SOB, diaphoresis, or CP with this episode. Troponin was negative x 2 and EKG without ischemic changes on admission. Lipid panel 9/18- Total cholesterol 136, TG 79, HDL 15, LDL 105  - Lipitor 20mg  daily started 9/18  - Coreg 6.25mg  BID, started 9/18  - Obtained rcords from hospital in [place], [place]   #Tobacco abuse: Quit smoking 1 week ago, no nicotine patch needed/desired   #Presumed OSA: Pt admits to snoring and apnea at night. Does not endorse HA, but states he easily falls asleep during the day. OSA may be cause of RHF. Tried CPAP in the hospital, but it caused too much anxiety. He is willing to retry the CPAP. -will need outpatient sleep study   #DVT PPx: Lovenox, SCDs   Dispo: Disposition is deferred at this time, awaiting improvement of current medical problems.  Anticipated discharge in approximately 2-3 day(s).   The patient does have a current PCP Windell Hummingbird, MD) and does need an [facility] hospital follow-up appointment after discharge.  The patient does not have transportation limitations that hinder transportation  to clinic appointments.  .Services Needed at time of discharge: Y = Yes, Blank = No PT:   OT:   RN:   Equipment:   Other:     LOS: 6 days  Windell Hummingbird, MD 11/03/2012, 11:03 AM

## 2012-11-03 NOTE — CV Procedure (Addendum)
PERCUTANEOUS CORONARY INTERVENTION   Devon [name] is a 52 y.o. male  INDICATION: Acute chest pain left and right heart catheterization with coronary angiography.  After completing the diagnostic catheterization and during the process of moving the patient to the stretcher to leave the catheterization laboratory, he began complaining of chest pain. I have the team to put the patient back on the table while I reviewed his angiogram. The angiogram revealed that the second obtuse marginal was embolized with either thrombus or atherosclerotic debris from either the catheter are intrinsically/de novo clot present within the previously stented mid segment. In the setting of chest pain I felt that the occluded distal portion of the second obtuse marginal was the likely source of the patient's pain. For this reason we decided to restart it the patient and to attempt re\re cannulization.   PROCEDURE: 1. Drug-eluting stent implantation mid circumflex for in-stent restenosis  2. Angioplasty obtuse marginal #2   CONSENT: The risks, benefits, and details of the procedure were explained to the patient and his girlfriend on the emergency circumstances.. Risks including death, MI, stroke, bleeding, limb ischemia, renal failure and allergy were described and accepted by the patient.   Emergency consent was obtained prior to proceeding.  PROCEDURE TECHNIQUE:  After Xylocaine anesthesia a new 6 French arterial sheath was exchanged in the right femoral artery using double glove technique over a guidewire.  After removing the diagnostic sheath,  Coronary guiding shots were made using a 4 cm left Judkins guide catheter. Antithrombotic therapy,bivalirudin bolus and infusion, was begun and determined to be therapeutic by ACT. Antiplatelet therapy,180 mg of Brilinta, was loaded.  A Pro-water guidewire was used to easily across the total occlusion of the distal third of the obtuse marginal. I initially used the  guidewire to "Dotter" the lesion.. Multiple doses of intracoronary nitroglycerin were administered. We then placed a 2.0 x 12 mm long balloon into the occluded segment initially using the balloon to "Dotter". This did not reestablish flow. Multiple additional doses of intracoronary nitroglycerin were administered. I feel the lesion was too far distal to attempt catheter thrombectomy. We then began to notice that there was at times TIMI grade 2 flow into the distal obtuse marginal. I felt that this was the best we could do.  At completion of our work on the obtuse marginal we then turned attention to the restenotic lesion in the mid circumflex. This appeared to be greater than 90% obstructed. We stented the region of restenosis with a 16 x 2.75 mm diameter Promus Premier drug-eluting stent to 14 atmospheres. We then post dilated this stent to 14 atmospheres using a 3.0 x 12 mm  balloon. 2 balloon inflations were performed.  At completion of the procedure the restenotic lesion was reduced to less than 10%. The obtuse marginal continued to show evidence of TIMI grade 2 flow with very faint opacification of the very distal vessel.  The sheaths were left in place. The bivalirudin was decreased to 0.25 mg per kilogram per hour infusion rate. The patient was noted to be having significantly less pain at this time.  CONTRAST:  Total of 180 cc.  COMPLICATIONS:  none.    ANGIOGRAPHIC RESULTS:   1. 90% in-stent restenosis mid circumflex reduced to 0% with TIMI grade 3 flow. 2. Embolic obstruction of the obtuse marginal #2 and the distal third of its distribution, partially recanalized with angioplasty.   IMPRESSIONS:  1. Likely catheter induced thrombotic or atherosclerotic debris to the second obtuse marginal.  This occurred during the diagnostic procedure.  2. Attempt at recanalization of the second obtuse marginal was not very successful.  3. Drug-eluting stent implantation for 90% in-stent restenosis  of the mid circumflex reducing the obstruction to less than 10% with TIMI grade 3 flow.   RECOMMENDATION:  1. Bivalirudin continuous infusion for 6 hours at .25 mg/kg/hr rate.  2.  IV nitroglycerin x 24 hours  3. IV hydration due to contrast load in setting of increasing creatinine at the time of catheter  4. Aspirin and Brilinta.    Lesleigh Noe, MD 11/03/2012 12:44 PM

## 2012-11-03 NOTE — CV Procedure (Signed)
     Diagnostic Cardiac Catheterization with Coronary Angiography and Right Heart  Report  Devon [name]  52 y.o.  male July 13, 1960  Procedure Date: 11/03/2012 Referring Physician: Rogelia Boga, MD Primary Cardiologist:: HWBSmith, III, MD   PROCEDURE:  Left heart catheterization with selective coronary angiography, left ventriculogram.  INDICATIONS:  Class III heart failure, prior myocardial infarction with stenting (2002), recent documentation of LVEF less than 20%. The studies being done to define cardiac hemodynamics and assess coronary anatomy.  The risks, benefits, and details of the procedure were explained to the patient.  The patient verbalized understanding and wanted to proceed.  Informed written consent was obtained.  PROCEDURE TECHNIQUE:  After Xylocaine anesthesia a 7 French venous and 6 French arterial sheath was placed in the right femoral artery with a single anterior needle wall stick.   Coronary angiography was done using a a 6 [place] MPA2, JL4, and JR 4 catheter.  Left ventriculography was not done.   Right heart catheterization was performed using a Swan-Ganz catheter. Oximetry sample was obtained from the main pulmonary artery as well as from the descending aorta. Thermodilution cardiac outputs were performed. A simultaneous LV with a pressure recording was made. CONTRAST:  Total of 50 cc.  COMPLICATIONS:  Embolic obstruction of the native circumflex either catheter or an intrinsic coronary related. It appears that the embolic debris was likely thrombus. Please see separate PCI report.  HEMODYNAMICS:  Aortic pressure was 113/78 mmHg; LV pressure was 112/8 mmHg; LVEDP 21 millimeters mercury; right atrial mean 12 mm mercury; right ventricular pressure 51/7 mmHg; PA pressure 50/25 mmHg; pulmonary capillary wedge mean 23 mm mercury; cardiac output 4.41 L per minute (Fick) and 4.01 L per minute (thermal); pulmonary vascular resistance 2.49 which limits.  There was no gradient  between the left ventricle and aorta.    ANGIOGRAPHIC DATA:   The left main coronary artery is widely patent.  The left anterior descending artery is diffuse irregularities but no significant obstruction.  The left circumflex artery is dominant vessel giving origin to the PDA and 2 large obtuse marginal branches. A mid vessel stent contains proximal stent margin 90% stenosis and also distal stent margin 90% stenosis. There is appearance the first obtuse marginal which has extension of stent from the circumflex into its proximal segment is totally occluded. All the second shot of the left coronary system the obstruction and this obtuse marginal embolizes to the distal third of the obtuse marginal. (This was not recognized during the angiographic procedure).  The right coronary artery is diffusely diseased and nondominant.  LEFT VENTRICULOGRAM:  Left ventricular angiogram was done in the 30 RAO projection was not performed to conserve contrast exposure.  IMPRESSIONS:  1. 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  2. Luminal irregularities throughout the LAD and right coronary but no significant obstruction  3. Moderate to moderately severe pulmonary hypertension  4. Elevated left ventricular end-diastolic pressure and pulmonary capillary wedge pressure  5. Left ventricular systolic dysfunction out of proportion to the degree of coronary disease. Echo LVEF 15%  6. When the patient awakened from sedation he was complaining of chest pain. This is after we had broken scrub and the patient was in the process of being moved to a stretcher.   RECOMMENDATION:  1. Because of chest pain I decided to re\re looked at the patient's coronaries. Please see the separate report.

## 2012-11-03 NOTE — Progress Notes (Signed)
1645  11/03/12   Dr. Katrinka Blazing made aware of 5 beat run of V-Tach and bigeminy.  Caral Whan, Linnell Fulling

## 2012-11-03 NOTE — Progress Notes (Signed)
Nutrition Consult/Brief Note  RD consult for CHF diet education.  Patient not appropriate for education; s/p cardiac cath and drowsy upon visit.  RD provided "Heart Failure Nutrition Therapy" handout from the [employer] to patient's fiance.  RD to follow-up at later date.  Maureen Chatters, RD, LDN Pager #: [phone] After-Hours Pager #: [phone]

## 2012-11-03 NOTE — Progress Notes (Signed)
11/03/2012  1730  Venous and Arterial sheath removed. Pressure held times 20 minutes.  Site Level 0 before and after removal.  VSS  No c/o.  Rt pedal pulse 2+.  Instructions given and bedrest to continue til 2130.  Jaylin Benzel, Linnell Fulling

## 2012-11-03 NOTE — H&P (Signed)
The patient had no problems overnight. I noticed this morning that his laboratory data is abnormal with mild hyperkalemia and a significant bump in creatinine. Yesterday, had hoped to hold  IV Lasix and lisinopril, but the heart failure team resumed IV Lasix and added spironolactone and increased lisinopril. I believe this accounts for the modest disturbance in laboratory data.  We will proceed with catheterization, being careful to hold diuretic therapy, potassium, and lisinopril.  The procedure and risks were discussed with the patient in detail. Other than as stated above. No significant changes.

## 2012-11-04 DIAGNOSIS — I252 Old myocardial infarction: Secondary | ICD-10-CM | POA: Diagnosis not present

## 2012-11-04 DIAGNOSIS — I509 Heart failure, unspecified: Secondary | ICD-10-CM

## 2012-11-04 LAB — CBC
HCT: 42 % (ref 39.0–52.0)
Hemoglobin: 15 g/dL (ref 13.0–17.0)
MCV: 80.2 fL (ref 78.0–100.0)
RBC: 5.24 MIL/uL (ref 4.22–5.81)
WBC: 10.6 10*3/uL — ABNORMAL HIGH (ref 4.0–10.5)

## 2012-11-04 LAB — BASIC METABOLIC PANEL
BUN: 20 mg/dL (ref 6–23)
CO2: 25 mEq/L (ref 19–32)
Chloride: 104 mEq/L (ref 96–112)
GFR calc Af Amer: >90 mL/min (ref >90)
Potassium: 4.1 mEq/L (ref 3.5–5.1)

## 2012-11-04 LAB — RETICULOCYTES
RBC.: 5.49 MIL/uL (ref 4.22–5.81)
Retic Count, Absolute: 142.7 10*3/uL (ref 19.0–186.0)
Retic Ct Pct: 2.6 % (ref 0.4–3.1)

## 2012-11-04 LAB — TROPONIN I
Troponin I: 2.98 ng/mL (ref <0.30)
Troponin I: 2.88 ng/mL (ref <0.30)

## 2012-11-04 LAB — IRON AND TIBC
Saturation Ratios: 8 % — ABNORMAL LOW (ref 20–55)
UIBC: 355 ug/dL (ref 125–400)

## 2012-11-04 LAB — FOLATE: Folate: >20 ng/mL

## 2012-11-04 LAB — FERRITIN: Ferritin: 231 ng/mL (ref 22–322)

## 2012-11-04 MED ORDER — HYDROCOD POLST-CHLORPHEN POLST 10-8 MG/5ML PO LQCR
5.0000 mL | Freq: Two times a day (BID) | ORAL | Status: DC | PRN
  Administered 2012-11-04 – 2012-11-05 (×2): 5 mL via ORAL
  Filled 2012-11-04 (×2): qty 5

## 2012-11-04 MED ORDER — LISINOPRIL 5 MG PO TABS
5.0000 mg | ORAL_TABLET | Freq: Two times a day (BID) | ORAL | Status: DC
  Administered 2012-11-04 – 2012-11-05 (×3): 5 mg via ORAL
  Filled 2012-11-04 (×4): qty 1

## 2012-11-04 MED ORDER — ENOXAPARIN SODIUM 40 MG/0.4ML ~~LOC~~ SOLN
40.0000 mg | SUBCUTANEOUS | Status: DC
  Administered 2012-11-04 – 2012-11-05 (×2): 40 mg via SUBCUTANEOUS
  Filled 2012-11-04 (×2): qty 0.4

## 2012-11-04 MED FILL — Sodium Chloride IV Soln 0.9%: INTRAVENOUS | Qty: 50 | Status: AC

## 2012-11-04 NOTE — Progress Notes (Signed)
Advanced Heart Failure Rounding Note   Subjective:    Devon [name] is a 52 year old man with history of DM2 (diagnosed at urgent care last week), HTN (diagnosed at urgent care last week), ETOH, CAD s/p anterior MI 2004 [place] in 2004 with cath and stent x 2 placed at that time. Snores a lot.   Admitted with ADHF. 10/30/12 ECHO EF 15% with diffuse HK (LVIDd 61 mm) RV mildly dilated severely   Post cath getting 100cc/hr due to dye load. Later received 40 mg IV lasix.  Denies PND. Mild dyspnea with exertion.  11/03/12 R & LHC  S/p PCI/stenting of LCX with embolization to OM-1 RA 12 RV 51/7 PA 50/25 PCWP 23 LVEDP 21 Fick 4.0 Thermo 4.4  Mildly dyspneic this am. Weight stable. I/O -2L. No CP.    Objective:   Weight Range:  Vital Signs:   Temp:  [97.6 F (36.4 C)-98.6 F (37 C)] 98 F (36.7 C) (09/24 0400) Pulse Rate:  [35-92] 76 (09/23 2200) Resp:  [0-35] 33 (09/24 0600) BP: (111-186)/(54-119) 149/96 mmHg (09/24 0600) SpO2:  [93 %-100 %] 97 % (09/24 0600) Arterial Line BP: (143-183)/(81-110) 175/107 mmHg (09/23 1700) Weight:  [241 lb 6.5 oz (109.5 kg)-242 lb 8.1 oz (110 kg)] 241 lb 6.5 oz (109.5 kg) (09/24 0600) Last BM Date: 11/03/12  Weight change: Filed Weights   11/03/12 0525 11/03/12 1015 11/04/12 0600  Weight: 241 lb 10 oz (109.6 kg) 242 lb 8.1 oz (110 kg) 241 lb 6.5 oz (109.5 kg)    Intake/Output:   Intake/Output Summary (Last 24 hours) at 11/04/12 0817 Last data filed at 11/04/12 0640  Gross per 24 hour  Intake 2256.43 ml  Output   5085 ml  Net -2828.57 ml     Physical Exam: General:  Sitting in chair. Well appearing. No resp difficulty HEENT: normal Neck: supple. JVP 6-7. Carotids 2+ bilat; no bruits. No lymphadenopathy or thryomegaly appreciated. Cor: PMI nondisplaced. Regular rate & rhythm. No rubs, gallops or murmurs. Lungs: clear Abdomen: soft, nontender, nondistended. No hepatosplenomegaly. No bruits or masses. Good bowel sounds. Extremities: no  cyanosis, clubbing, rash, edema Neuro: alert & orientedx3, cranial nerves grossly intact. moves all 4 extremities w/o difficulty. Affect pleasant  Telemetry:  SR 70s  Labs: Basic Metabolic Panel:  Recent Labs Lab 10/28/12 1131 10/28/12 1225  10/31/12 0605 11/01/12 0426 11/02/12 0345 11/03/12 0500 11/04/12 0600  NA 131*  --   < > 133* 134* 136 136 141  K 4.9  --   < > 3.9 3.4* 3.8 5.3* 4.1  CL 95*  --   < > 94* 96 99 99 104  CO2 23  --   < > 25 28 29 28 25   GLUCOSE 151*  --   < > 96 125* 112* 101* 105*  BUN 22  --   < > 26* 27* 24* 24* 20  CREATININE 1.14  --   < > 1.25 1.23 1.19 1.38* 0.92  CALCIUM 8.8  --   < > 9.3 8.6 8.7 8.8 8.5  MG  --  2.1  --   --   --   --   --   --   PHOS  --  4.5  --   --   --   --   --   --   < > = values in this interval not displayed.  Liver Function Tests:  Recent Labs Lab 10/29/12 0600 10/30/12 0450 10/31/12 1478 11/01/12 0426 11/02/12 0345  AST 322* 201* 203* 91* 53*  ALT 316* 275* 311* 202* 149*  ALKPHOS 94 89 110 96 110  BILITOT 1.9* 1.7* 1.8* 1.3* 0.9  PROT 6.7 6.5 7.6 6.3 6.4  ALBUMIN 3.2* 3.2* 3.6 3.0* 3.0*    Recent Labs Lab 10/28/12 1225  LIPASE 37   No results found for this basename: AMMONIA,  in the last 168 hours  CBC:  Recent Labs Lab 10/28/12 1131 11/04/12 0600  WBC 9.3 10.6*  HGB 13.6 15.0  HCT 37.8* 42.0  MCV 80.3 80.2  PLT 257 300    Cardiac Enzymes:  Recent Labs Lab 10/30/12 1040  TROPONINI <0.30    BNP: BNP (last 3 results)  Recent Labs  10/26/12 1927 10/28/12 1225  PROBNP 6306.0* 4543.0*     Other results:   Imaging: No results found.   Medications:     Scheduled Medications: . aspirin EC  81 mg Oral Daily  . atorvastatin  20 mg Oral q1800  . carvedilol  6.25 mg Oral BID WC  . digoxin  0.125 mg Oral Daily  . furosemide  40 mg Intravenous BID  . Ticagrelor  90 mg Oral BID    Infusions: . nitroGLYCERIN 10 mcg/min (11/04/12 0040)    PRN  Medications: acetaminophen, albuterol, chlorpheniramine-HYDROcodone, fentaNYL, ipratropium, ondansetron (ZOFRAN) IV   Assessment:   1. A/C Systolic Heart Failure EF 15% with biventricular HF  2. CAD- s/p anterior MI MI with stents 2004  3. HTN  4. DM  5. Pulmonary nodules - repeat CT in 3 months  6. Former tobacco  7. ETOH use  8. OSA, refusing CPAP      Plan/Discussion:   Mild volume overload. Continue IV lasix today and hopefully transition to po tomorrow. Restart lisinopril 5 mg bid. Continue carvedilol 6.25 mg twice a day and digoxin 0.125 mg daily. Follow renal function closely. He will need close follow up in the HF clinic.   Cardiac rehab to follow up today. Dietitian consult appreciated.   Hopefully home tomorrow.    CLEGG,AMY, NP-C8:17 AM Length of Stay: 7 11/04/2012, 8:17 AM Advanced Heart Failure Team Pager [phone] (M-F; 7a - 4p)  Please contact [facility] for night-coverage after hours (4p -7a ) and weekends on [email or web address]  Patient seen and examined with Tonye Becket, NP. We discussed all aspects of the encounter. I agree with the assessment and plan as stated above.   Continues to improve. Agree with IV lasix today and reinitiation of HF meds. Renal function stable. Hopefully home tomorrow or Friday with close HF f/u.  Torri Langston,MD 8:58 AM

## 2012-11-04 NOTE — Discharge Summary (Signed)
Name: Devon [name] MRN: [id number] DOB: August 11, 1960 52 y.o. PCP: Windell Hummingbird, MD  Date of Admission: 10/28/2012 11:19 AM Date of Discharge: 11/06/2012 Attending Physician: No att. providers found  Discharge Diagnosis: Principal Problem:   Acute systolic heart failure-in setting of known CAD; EF 15%, newly diagnosed this admission Active Problems:   SOB (shortness of breath)- 2/2 A/C CHF with perhaps some element of PNA   Bronchopneumonia   CHF with right heart failure- perhaps 2/2 undiagnosed OSA   HTN (hypertension)   Pulmonary nodules- will need repeat CT scan in 3-6 months   Tobacco abuse   Elevated LFTs- likely 2/2 hepatic congestion from A/C CHF   Congestive dilated cardiomyopathy   Subclinical hypothyroidism   Non-ST elevation MI (NSTEMI)   Questionable OSA- will need sleep study done   CAD s/p 2 stents to LAD  Discharge Medications:   Medication List    STOP taking these medications       levofloxacin 500 MG tablet  Commonly known as:  LEVAQUIN      TAKE these medications       aspirin EC 81 MG tablet  Take 81 mg by mouth daily.     atorvastatin 20 MG tablet  Commonly known as:  LIPITOR  Take 1 tablet (20 mg total) by mouth daily at 6 PM.     carvedilol 6.25 MG tablet  Commonly known as:  COREG  Take 1 tablet (6.25 mg total) by mouth 2 (two) times daily with a meal.     dextromethorphan-guaiFENesin 30-600 MG per 12 hr tablet  Commonly known as:  MUCINEX DM  Take 1 tablet by mouth every 12 (twelve) hours.     digoxin 0.125 MG tablet  Commonly known as:  LANOXIN  Take 1 tablet (0.125 mg total) by mouth daily.     furosemide 40 MG tablet  Commonly known as:  LASIX  Take 1 tablet (40 mg total) by mouth daily.     lisinopril 5 MG tablet  Commonly known as:  PRINIVIL,ZESTRIL  Take 1 tablet (5 mg total) by mouth 2 (two) times daily.     spironolactone 12.5 mg Tabs tablet  Commonly known as:  ALDACTONE  Take 0.5 tablets (12.5 mg total) by mouth  daily.     Ticagrelor 90 MG Tabs tablet  Commonly known as:  BRILINTA  Take 1 tablet (90 mg total) by mouth 2 (two) times daily.       Disposition and follow-up:   DevonAhmaud [name] was discharged from [facility] in Stable condition.  At the hospital follow up visit please address:  1.  Right and Left heart failure- Patient w/ h/o CAD and prior MI s/p stents and was not followed previously with any physicians and is now in the process of managing many medications he was not taking previously. Please review patient's medication compliance/understanding, dietary changes, weight loss, and weighing himself at home.  2.  Left lower lobe pulmonary nodules found on CT scan- Radiology recommended follow up chest CT in 3-6 months from this admission to assess for progression of the nodules.  3. Colonoscopy- Patient scheduled for appt with GI for consultation for colonoscopy on Nov 17, 2012. Please remind patient to follow up given his anemia (though anemia most likely 2/2 chronic disease).  4. Presumed OSA- Patient with apneic episodes during sleep with daytime drowsiness, likely OSA. However he has never had a sleep study and would benefit from one. He tried CPAP while hospitalized and  this gave him significant anxiety. He may benefit from a nasal mask.   4. History of tobacco use- Patient admits to smoking 1-2 packs per week for four years, but denies use before this. He quit one week prior to admission and reports he will stop smoking from now on. Please counsel accordingly.   2.  Labs / imaging needed at time of follow-up: CT scan in 3-6 months from 10/2012, CMP (monitor Cr and electrolytes given recent addition of lasix and recheck LFTs), CBC (pt with what appears to be anemia of chronic disease), sleep study  3.  Pending labs/ test needing follow-up: none  Follow-up Appointments: Follow-up Information   Follow up with Arvilla Meres, MD On 11/11/2012. ( at 3:20 garage code [id number])     Specialty:  Cardiology   Contact information:   [address] [place] [address] [phone]       Follow up with Janalyn Harder, MD On 11/13/2012. (2:00PM)    Specialty:  Internal Medicine   Contact information:   [address] [place] [address] [phone]      Discharge Instructions: Discharge Orders   Future Appointments Provider Department Dept Phone   11/11/2012 3:20 PM [facility] [phone]   11/13/2012 2:00 PM Linward Headland, MD  [facility] [phone]   Future Orders Complete By Expires   (HEART FAILURE PATIENTS) Call MD:  Anytime you have any of the following symptoms: 1) 3 pound weight gain in 24 hours or 5 pounds in 1 week 2) shortness of breath, with or without a dry hacking cough 3) swelling in the hands, feet or stomach 4) if you have to sleep on extra pillows at night in order to breathe.  As directed    Amb Referral to Cardiac Rehabilitation  As directed    Call MD for:  difficulty breathing, headache or visual disturbances  As directed    Call MD for:  temperature >100.4  As directed    Diet - low sodium heart healthy  As directed    Increase activity slowly  As directed      Consultations:   Lesleigh Noe, MD- cardiology Dr. Gala Romney- heart failure team  Procedures Performed:  Dg Chest 2 View  10/26/2012   CLINICAL DATA:  Abnormal chest sounds. Cough.  EXAM: CHEST  2 VIEW  COMPARISON:  No priors.  FINDINGS: Diffuse peribronchial cuffing and mild interstitial prominence throughout the lungs bilaterally. No consolidative airspace disease. Possible trace bilateral pleural effusions. No evidence of pulmonary edema. Heart size is mildly enlarged. Mediastinal contours are otherwise unremarkable.  IMPRESSION: 1. The appearance of the chest suggests either severe bronchitis or early multilobar bronchopneumonia. 2. Trace bilateral pleural effusions are  suspected. 3. Mild cardiomegaly.   Electronically Signed   By: Trudie Reed M.D.   On: 10/26/2012 20:16   Ct Angio Chest Pe W/cm &/or Wo Cm  10/28/2012   CLINICAL DATA:  Shortness of Breath  EXAM: CT ANGIOGRAPHY CHEST WITH CONTRAST  TECHNIQUE: Multidetector CT imaging of the chest was performed using the standard protocol during bolus administration of intravenous contrast. Multiplanar CT image reconstructions including MIPs were obtained to evaluate the vascular anatomy.  CONTRAST:  OMNIPAQUE IOHEXOL 350 MG/ML SOLN  COMPARISON:  Chest radiograph October 28, 2012  FINDINGS: There is no demonstrable pulmonary embolus. There is no thoracic aortic aneurysm.  Prominence of the hepatic veins and inferior vena cava  raise question of a degree of right heart failure.  On axial slice 63, there is an 8 by 7 mm nodular opacity abutting the pleura in the lateral segment of the left lower lobe. On this same slice, there is a 5 x 5 mm nodular opacity in the periphery of the posterior segment of the right lower lobe. The interstitium is diffusely prominent with a suspected mild degree of interstitial edema. There is no airspace consolidation. There is a small right pleural effusion.  There are several lymph nodes adjacent to the aortic arch and proximal descending thoracic aorta extending to the aortopulmonary window. The largest of these lymph nodes measures 2.2 by 1.6 cm. There is a lymph node just anterior to the Carina on the right measuring 2.0 by 1.5 cm. Several smaller lymph nodes are noted in the hila and mediastinum. There is a right hilar lymph node measuring 1.5 x 1.4 cm.  Heart is mildly enlarged. There is a minimal pericardial effusion.  The visualized upper abdominal structures appear normal except for prominence of the visualized inferior vena cava and hepatic veins.  There are no blastic or lytic bone lesions. Thyroid appears normal.  Review of the MIP images confirms the above findings.  IMPRESSION:  No demonstrable pulmonary embolus.  Suspect a degree of congestive heart failure. There is felt to be a degree of right sided heart failure as at least part of the congestive heart failure pattern.  Two nodular opacities in the left lower lobe. Followup of these nodular lesion should be based on Fleischner society guidelines.  Mild adenopathy of uncertain etiology.  Minimal pericardial effusion.  If the patient is at high risk for bronchogenic carcinoma, follow-up chest CT at 3-62months is recommended. If the patient is at low risk for bronchogenic carcinoma, follow-up chest CT at 6-12 months is recommended. This recommendation follows the consensus statement: Guidelines for Management of Small Pulmonary Nodules Detected on CT Scans: A Statement from the Fleischner Society as published in Radiology 2005; 237:395-400.   Electronically Signed   By: Bretta Bang   On: 10/28/2012 15:16   Dg Abd Acute W/chest  10/28/2012   CLINICAL DATA:  Upper abdominal pain. Cough. Bronchitis. And subsequent encounter.  EXAM: ACUTE ABDOMEN SERIES (ABDOMEN 2 VIEW & CHEST 1 VIEW)  COMPARISON:  Two-view chest 10/26/2012.  FINDINGS: The heart is mildly enlarged. Extensive interstitial coarsening is again noted. No focal airspace consolidation is evident. Small bilateral pleural effusions are more evident.  Supine and upright views the abdomen demonstrate a nonspecific bowel gas pattern. There is no evidence for obstruction or free air.  IMPRESSION: 1. Persistent diffuse interstitial pattern compatible with severe bronchitis or early bronchopneumonia. 2. Small bilateral pleural effusions. 3. Normal radiographic appearance of the abdomen.   Electronically Signed   By: Gennette Pac   On: 10/28/2012 12:46   2D ECHO:  10/30/12  Study Conclusions - Left ventricle: The cavity size was mildly dilated. Wall thickness was normal. The estimated ejection fraction was 15%. Diffuse hypokinesis. Doppler parameters are consistent with  restrictive physiology, indicative of decreased left ventricular diastolic compliance and/or increased left atrial pressure. - Mitral valve: Mild regurgitation. - Left atrium: The atrium was mildly dilated. - Right ventricle: The cavity size was mildly dilated.Systolic function was severely reduced. - Right atrium: The atrium was mildly dilated. - Pulmonary arteries: Systolic pressure was mildly increased. PA peak pressure: 35mm Hg (S). - Pericardium, extracardiac: A trivial pericardial effusion was identified.   Heart Cath 11/03/2012  First cath-  IMPRESSIONS: 1. 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  2. Luminal irregularities throughout the LAD and right coronary but no significant obstruction  3. Moderate to moderately severe pulmonary hypertension  4. Elevated left ventricular end-diastolic pressure and pulmonary capillary wedge pressure  5. Left ventricular systolic dysfunction out of proportion to the degree of coronary disease. Echo LVEF 15%  6. When the patient awakened from sedation he was complaining of chest pain. This is after we had broken scrub and the patient was in the process of being moved to a stretcher.  RECOMMENDATION: 1. Because of chest pain I decided to re\re looked at the patient's coronaries. Please see the separate report.   Repeat Cath on 11/03/2012-  IMPRESSIONS: 1. Likely catheter induced thrombotic or atherosclerotic debris to the second obtuse marginal. This occurred during the diagnostic procedure.  2. Attempt at recanalization of the second obtuse marginal was not very successful.  3. Drug-eluting stent implantation for 90% in-stent restenosis of the mid circumflex reducing the obstruction to less than 10% with TIMI grade 3 flow.  RECOMMENDATION: 1. Bivalirudin continuous infusion for 6 hours at .25 mg/kg/hr rate.  2. IV nitroglycerin x 24 hours  3. IV hydration due to contrast load in setting of  increasing creatinine at the time of catheter  4. Aspirin and Brilinta.  Admission HPI:  Devon [name] is a 52 year old man with history of DM2 (diagnosed at urgent care this week), HTN (diagnosed at urgent care this week), CAD (per pt MI in [place] in 2004 with cath and stent x 2 placed at that time) who presents for shortness of breath and nonproductive cough x 3-4 weeks. He sought care at [facility] last Thursday 9/11 and then again on Monday 9/15 at which time he was diagnosed with pneumonia and sent to the ED. Pt was started on Levaquin and Mucinex and reports good medication compliance, but his symptoms have not improved since that time so he returned to ED today. He states that he has not been able to sleep due to severe cough, he also gets some left sided abdominal pain when he coughs hard. No runny nose, sore throat, ear pain, n/v/d. No sick contacts, no recent travel, no recent hospitalizations He endorses orthopnea but no chest pain, palpitations, PND, lower extremity swelling. Of note, pt quit smoking one week ago, prior to that he had smoked two packs per week for many years.   Acute abdomen series in ED showed "persistent diffuse interstitial pattern compatible with severe bronchitis or early bronchopneumonia," small bilateral pleural effusions, and normal appearance of abdomen. CTA chest showed no evidence of pulmonary embolus but "degree of righy sided heart failure" as well as two nodular opacities in left lower lobe with recommendation for repeat chest CT in 3-6 months. BNP elevated at 4543 but decreased from 6306 on 9/15. Troponin x1 negative. Pt received dose of Lasix in ED given elevated BNP. He was satting 99% at rest but desatted to 80s-90% with ambulation thus was admitted for further evaluation. He was placed on supplemental oxygen and went back into upper 90s.   Prior to this admission, pt did not take any medications other than occasional aspirin. DM2 and HTN were diagnosed  at urgent care on 9/15. FH of HTN (father, brother, sister), DM2 (sister) but not CAD.   Hospital Course by problem list:  Acute on chronic systolic CHF with right sided HF:  Upon admission, patient did not have known history of CHF, but per records from [place], [place] patient had EF 30% in 2006 (at time of his last MI). Echo done 9/19 showed EF of 15% c/w systolic CHF as well as severely compromised function of R ventricle. His proBNP was elevated on admission at 4543 which was decreased from 6306 on 9/15, and pt did endorse some orthopnea. On exam however, he never appeared obviously volume overloaded, no crackles on lung exam, no lower extremity edema. Cardiology was consulted and thought patient appeared volume overloaded, therefore started lasix 40mg  IV q8h, which patient responded well to- down 19lbs since admission. Patient received R and L heart cath on 9/23 where he received DES to LAD for in stent thrombosis as well as angioplasty to OM2 (see below "CAD" section). Cath also showed moderate to moderate-severe pulmonary HTN. HF team guided heart failure medications and patient was managed on decreasing doses of lasix w/ eventual transition to 40mg  PO daily. Other discharge medications include:  spironolactone 12.5mg  daily, lisinopril 5mg  BID, coreg 6.25mg  BID, digoxin .125mg  daily, brilinta 90mg  BID, ASA 81mg  daily. Patient was seen by cardiac rehab and will follow with them as an outpatient. Cr was followed given his diuresis and remained fairly stable, Cr 1.00 at discharge. K was 4.0 on discharge. Per cardiology, patient may need AICD or BiV device eventually if his EF does not improved with optimal therapy. He is scheduled to follow up with heart failure as well as IMC next week.   #Possible bronchopneumonia: On admission, patient had been previously started on levaquin for presumed PNA by urgent care. In the ED, his CXR showed possible bronchopneumonia vs fluid overload. Pt afebrile, VSS, no  leukocytosis. He had desatted in the ED to the 80s with ambulation on day of admission, but as of 9/19, patient no longer required supplemental O2 either at rest or with ambulation. He was started on Rocephin in the ED and completed 5 day course of Doxycyclin and Amoxicillin during this hospitalization. Patient's breathing improved dramatically during his hospitalization, though unclear if this was due to treatment of his CHF or if in fact patient did have a pneumonia upon admission. Patient was treated symptomatically with tussionex prn and duonebs prn. Given patient does have smoking history and with some cough upon discharge, may consider performing PFTs as outpatient to assess for possible COPD, though I believe this is unlikely and that most of his residual DOE and cough are 2/2 CHF. Of note, on chest CT, there was concern for pulmonary nodules in left lower lobe that will need to be reevaluated by repeat  CT scan in 3-33mo.   #HTN: Patient's BP elevated on admission, 150s-160s/100s-120s. He has never been on any antihypertensives prior to this admission and is not followed by a PCP. Lisinopril, Coreg, lasix, and spironolactone were started on admission. With the addition of this combination of medications, patient's blood pressures have been very well controlled, 110-120s/65-80s. He will follow up with [facility].  #Normocytic anemia: Per chart review, patient had Hb 13 in 2006 during a previous admission in [place]. Hb was 9.6-->9.3-->10.6, MCV 80 during this hospitalization. Anemia panel showed Fe level low at 30 with normal ferritin and TIBC. Folate and B12 normal. Reticulocyte index 2.6, which is appropriate. Likely anemia of chronic disease given low Fe level, normal ferritin level, and severity of CHF and that it has been ongoing and untreated for years.  #Subclinical hypothyroidism:  Patient with TSH of 6.69, Free T4 wnl.  We will not treat given patient is asymptomatic and free T4 is in normal range.  #Long  QTc: Pt had long QT (506) on admission and had been started on Levaquin on 9/15 for presumed CAP at urgent care center so this was promptly stopped. This has resolved with QTc 461 on EKG 9/23.    #Previous diagnosis of DM: Apparently pt was diagnosed with diabetes at urgent care on week prior to admission. However, HbA1c 6.2%, so he is not considered a diabetic, as >6.5% is considered diabetic. He is overweight and at risk for development of diabetes in the future. He will definitely need outpatient follow up of his blood glucose. Patient kept on heart diet and saw diabetes coordinator while hospitalized. Patient intends of making lifestyle changes upon discharge.  #Elevated transaminases: LFTs elevated on admission: AST 428, ALT 289, GGT 141, total bili 3.2. LFTs improved during his admission- last checked LFTs 9/22: AST 53, ALT 149, Total bili .9 and last GGT 132. Most likely due to hepatic congestion 2/2 A/C CHF given it improved with diuresis. EtOH abuse may be contributing, especially given close to 2:1 AST to ALT ratio and elevated GGT. Pt states he drinks only on weekends, will drink up to 2 six packs of beer by himself, last drink on ?9/13. Denies any alcohol use during the weekdays. Patient was on levaquin prior to admission, which can be hepatotoxic, so this may have contributed (levaquin stopped 9/17). Hepatitis panel was negative and HIV nonreactive. May want to check CMP as outpatient to make sure this is continuing to resolve.   #LLQ Musculoskeletal abdominal pain: Patient presented with LLQ pain with coughing. As his SOB and cough improved, this pain did as well. Thought to be musculoskeletal in origin. However, lipase was checked on admission to r/o pancreatitis and was wnl.   #CAD: Patient has h/o MI in 2006 in [place]. Obtained records from hospital in [place], [place] showed EF at this time was 30% with normal RV function. On admission, troponin was negative x 2 and EKG without ischemic  changes. Lipid panel 9/18- Total cholesterol 136, TG 79, HDL 15, LDL 105 - therefore, lipitor 20mg  daily was started. Patient had R and L heart cath during his hosptialization which showed diffuse RCA disease, 90% in stent thrombosis in LAD and occlusion of OM2. Patient had DES placed to LAD. Patient with CP after this procedure, so repeat cath was done at which time it was thought an embolus was lodged in OM2, angioplasty was only somewhat successful. After this event, patient had troponins elevated x 3. Patient without CP and with only some mild DOE one day following this procedure. Meds as above.   #Tobacco abuse: Patient smoked 1-2 packs per week for four years, but did not smoke prior to this. Quit smoking 1 week prior to admission since he was not breathing well. He declined NRT while hospitalized. Patient is adamant that he will not smoke any longer once he is discharged.   #Presumed OSA: Pt admits to snoring and apnea at night. Does not endorse HA, but does have daytime drowsiness. OSA may be cause of pulmonary HTN seen on R heart cath, which may be the cause of his RHF. Patient did have cath back in 2006 that showed normal RV function, so this is new since that time. Tried CPAP in the hospital, but it caused too much anxiety. He is willing to retry the CPAP nasal mask. He will need outpatient sleep study  Discharge Vitals:   BP 93/59  Pulse 60  Temp(Src) 98.2 F (36.8 C) (Oral)  Resp 20  Ht 5\' 10"  (1.778 m)  Wt 107.412 kg (236 lb 12.8 oz)  BMI 33.98 kg/m2  SpO2 98%  Discharge Labs:  Results for orders placed during the hospital encounter of 10/28/12 (from the past 24 hour(s))  BASIC METABOLIC PANEL     Status: Abnormal   Collection Time    11/05/12  7:50 AM      Result Value Range   Sodium 135  135 - 145 mEq/L   Potassium 4.0  3.5 - 5.1 mEq/L   Chloride 98  96 - 112 mEq/L   CO2 27  19 - 32 mEq/L   Glucose, Bld 103 (*) 70 - 99 mg/dL   BUN 18  6 - 23 mg/dL   Creatinine, Ser 1.61   0.50 - 1.35 mg/dL   Calcium 9.2  8.4 - 09.6 mg/dL   GFR calc non Af Amer 85 (*) >90 mL/min   GFR calc Af Amer >90  >90 mL/min  TROPONIN I     Status: Abnormal   Collection Time    11/05/12  7:50 AM      Result Value Range   Troponin I 1.23 (*) <0.30 ng/mL    Signed: Windell Hummingbird, MD 11/06/2012, 6:44 AM   Time Spent on Discharge: 35 minutes Services Ordered on Discharge: none Equipment Ordered on Discharge: none

## 2012-11-04 NOTE — Progress Notes (Signed)
11/04/2012 5:32 PM No beds available on 2W.  Dr. Anne Fu OK Pt to be able to transfer to 4700.  Trop. Results given to Dr. Lonia Blood, Linnell Fulling

## 2012-11-04 NOTE — Care Management Note (Signed)
    Page 1 of 1   11/04/2012     10:24:07 AM   CARE MANAGEMENT NOTE 11/04/2012  Patient:  Devon [name], Devon [name]   Account Number:  [id number]  Date Initiated:  11/02/2012  Documentation initiated by:  Oletta Cohn  Subjective/Objective Assessment:   52 yo admitted with Acute Respiratory Failure///home with spouse     Action/Plan:   IV ABX//RETURN HOME WITH SPOUSE   Anticipated DC Date:  11/04/2012   Anticipated DC Plan:  HOME/SELF CARE      DC Planning Services  CM consult  Medication Assistance      Choice offered to / List presented to:             Status of service:   Medicare Important Message given?   (If response is "NO", the following Medicare IM given date fields will be blank) Date Medicare IM given:   Date Additional Medicare IM given:    Discharge Disposition:  HOME/SELF CARE  Per UR Regulation:  Reviewed for med. necessity/level of care/duration of stay  If discussed at Long Length of Stay Meetings, dates discussed:   11/05/2012    Comments:  9/24 1023a debbie Jash Wahlen rn,bsn pt given brilinta 30day free and copay assist card.

## 2012-11-04 NOTE — Progress Notes (Signed)
Subjective: Patient complaining of DOE that improves after walking around for a few minutes. Denies CP this morning, though did have some yesterday. Cardiology and HF team are following patient closely and are managing most of his heart medications. Patient has appt on Oct 7th for a colonoscopy and given his dual antiplatelet therapy x 1 year, I will call GI to make sure he can still get this done as scheduled.   Objective: Vital signs in last 24 hours: Filed Vitals:   11/04/12 0300 11/04/12 0400 11/04/12 0500 11/04/12 0600  BP: 186/99 150/90 156/109 149/96  Pulse:      Temp:  98 F (36.7 C)    TempSrc:  Oral    Resp: 19 30 19  33  Height:      Weight:    109.5 kg (241 lb 6.5 oz)  SpO2: 100% 97% 93% 97%  on room air  Weight change: 0.4 kg (14.1 oz)  Intake/Output Summary (Last 24 hours) at 11/04/12 0644 Last data filed at 11/04/12 0640  Gross per 24 hour  Intake 3430.91 ml  Output   5085 ml  Net -1654.09 ml   General: sitting upright in chair, appears comfortable, NAD HEENT: vision grossly intact Cardiac: RRR, no rubs, murmurs or gallops Pulm: anterior lung fields clear to auscultation bilaterally, no wheezes, rales, or rhonchi Abd: soft, nontender, nondistended, BS present Ext: warm and well perfused, no pedal edema Neuro: alert and oriented X3, cranial nerves II-XII grossly intact, moving all extremities spontaneously and without difficulty; sensation grossly intact  Lab Results: Basic Metabolic Panel:  Recent Labs Lab 10/28/12 1131 10/28/12 1225  11/02/12 0345 11/03/12 0500  NA 131*  --   < > 136 136  K 4.9  --   < > 3.8 5.3*  CL 95*  --   < > 99 99  CO2 23  --   < > 29 28  GLUCOSE 151*  --   < > 112* 101*  BUN 22  --   < > 24* 24*  CREATININE 1.14  --   < > 1.19 1.38*  CALCIUM 8.8  --   < > 8.7 8.8  MG  --  2.1  --   --   --   PHOS  --  4.5  --   --   --   < > = values in this interval not displayed. Liver Function Tests:  Recent Labs Lab  11/01/12 0426 11/02/12 0345  AST 91* 53*  ALT 202* 149*  ALKPHOS 96 110  BILITOT 1.3* 0.9  PROT 6.3 6.4  ALBUMIN 3.0* 3.0*    Recent Labs Lab 10/28/12 1225  LIPASE 37   CBC:  Recent Labs Lab 10/28/12 1131 11/04/12 0600  WBC 9.3 10.6*  HGB 13.6 15.0  HCT 37.8* 42.0  MCV 80.3 80.2  PLT 257 300   Cardiac Enzymes:  Recent Labs Lab 10/30/12 1040  TROPONINI <0.30   BNP:  Recent Labs Lab 10/28/12 1225  PROBNP 4543.0*   D-Dimer:  Recent Labs Lab 10/28/12 1225  DDIMER 2.36*   CBG:  Recent Labs Lab 10/31/12 0543 11/01/12 0715 11/02/12 0606 11/03/12 0715 11/03/12 0957 11/03/12 1154  GLUCAP 112* 90 111* 103* 122* 120*   Hemoglobin A1C:  Recent Labs Lab 10/29/12 0600  HGBA1C 6.2*   Fasting Lipid Panel:  Recent Labs Lab 10/29/12 0600  CHOL 136  HDL 15*  LDLCALC 105*  TRIG 79  CHOLHDL 9.1   Coagulation:  Recent Labs Lab 10/29/12 0600  LABPROT 18.5*  INR 1.59*   Studies/Results: 2D ECHO:  10/30/12  Study Conclusions - Left ventricle: The cavity size was mildly dilated. Wall thickness was normal. The estimated ejection fraction was 15%. Diffuse hypokinesis. Doppler parameters are consistent with restrictive physiology, indicative of decreased left ventricular diastolic compliance and/or increased left atrial pressure. - Mitral valve: Mild regurgitation. - Left atrium: The atrium was mildly dilated. - Right ventricle: The cavity size was mildly dilated.Systolic function was severely reduced. - Right atrium: The atrium was mildly dilated. - Pulmonary arteries: Systolic pressure was mildly increased. PA peak pressure: 35mm Hg (S). - Pericardium, extracardiac: A trivial pericardial effusion was identified.  Heart Cath 11/03/2012 First cath- IMPRESSIONS: 1. 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  2. Luminal irregularities throughout the LAD and right coronary but no significant  obstruction  3. Moderate to moderately severe pulmonary hypertension  4. Elevated left ventricular end-diastolic pressure and pulmonary capillary wedge pressure  5. Left ventricular systolic dysfunction out of proportion to the degree of coronary disease. Echo LVEF 15%  6. When the patient awakened from sedation he was complaining of chest pain. This is after we had broken scrub and the patient was in the process of being moved to a stretcher.  RECOMMENDATION: 1. Because of chest pain I decided to re\re looked at the patient's coronaries. Please see the separate report.  Second Cath- IMPRESSIONS: 1. Likely catheter induced thrombotic or atherosclerotic debris to the second obtuse marginal. This occurred during the diagnostic procedure.  2. Attempt at recanalization of the second obtuse marginal was not very successful.  3. Drug-eluting stent implantation for 90% in-stent restenosis of the mid circumflex reducing the obstruction to less than 10% with TIMI grade 3 flow.  RECOMMENDATION: 1. Bivalirudin continuous infusion for 6 hours at .25 mg/kg/hr rate.  2. IV nitroglycerin x 24 hours  3. IV hydration due to contrast load in setting of increasing creatinine at the time of catheter  4. Aspirin and Brilinta.  Medications: I have reviewed the patient's current medications. Scheduled Meds: . aspirin EC  81 mg Oral Daily  . atorvastatin  20 mg Oral q1800  . carvedilol  6.25 mg Oral BID WC  . digoxin  0.125 mg Oral Daily  . furosemide  40 mg Intravenous BID  . Ticagrelor  90 mg Oral BID   Continuous Infusions: . nitroGLYCERIN 10 mcg/min (11/04/12 0040)   PRN Meds:.acetaminophen, albuterol, chlorpheniramine-HYDROcodone, fentaNYL, ipratropium, ondansetron (ZOFRAN) IV  Assessment/Plan:  Acute on chronic systolic CHF with right sided HF:   Pt does not have previous hx of CHF that he knows of, but heart cath done in 2006 revealed EF of 30%. Echo done 9/19 showed EF of 15% c/w systolic CHF as well  as severely compromised function of R ventricle. His proBNP was elevated on admission at 4543 which was decreased from 6306 on 9/15, and pt did endorse some orthopnea. Cardiology was consulted and thought patient appeared volume overloaded, therefore started lasix, which patient responded well to- down 14lbs since admission. Patient received heart cath yesterday where he received DES to LAD for in stent thrombosis as well as angioplasty to OM2. Cath also showed moderate to moderate-severe pulmonary HTN. Patient may need AICD or BiV device eventually if his EF does not improved with optimal therapy.  - HF team is guiding medication management for his heart failure at this point: Imdur discontinued, continue Lasix 40mg  IV BID, lisinopril increased to 5mg  BID (from  daily), added spironolactone 25mg  daily (appears to have been discontinued?), decreased coreg back to 6.25mg  BID, added digoxin .125mg  daily - continue Brilinta and ASA x 1 year - cardiac rehab - nutrition consult - Lipitor 20mg  daily  -K was elevated at 5.5 yesterday AM after receiving supplementation and with addition of spironolactone, Kdur was discontinued and today K is 4.1 -s/p contrast load yesterday Cr .92 (down from 1.38 yesterday) -trend troponins per cards  #Possible bronchopneumonia: On admission, patient had been previously started on levaquin for presumed PNA by urgent care. His CXR showed possible bronchopneumonia vs fluid overload. Pt afebrile, VSS, no leukocytosis. He had desatted in the ED to the 80s with ambulation, but as of 9/19, patient no longer requires supplemental O2 either at rest or with ambulation. Breathing is much improved now. He was started on Rocephin in the ED and completed 5 day course of Doxy and Amoxicillin.  On chest CT, there was concern for possible pulmonary nodules that will need to be reevaluated by repeat CT scan in 3-22mo. Patient does have hx of smoking 1-2 packs per week for four years, so may benefit  from outpatient PFTs if he continues having SOB despite control of his HF. -Discontinued doxycycline 100mg  PO BID and amoxicillin 1g TID (s/p Rocephin IV x 1 dose in ED)- total of 5 days received -duonebs q4h prn shortness of breath -pulse ox  -incentive spirometry  -repeat CT chest in 3-6 months per rads recs due to pulmonary nodules  -tussionex prn cough  -consider PFTs as outpatient in 3-6 weeks  #HTN: Patient's BP elevated on admission. He has never been on any antihypertensives previously. Lisinopril and Coreg were started on admission. With the addition of the Lasix, his blood pressure has been better controlled. However, patient's lasix and lisinopril held yesterday for the heart cath, so BPs slightly elevated today. All meds restarted today. - Lasix 40mg  IV BID (will adjust per cards)- plan to transition to PO tomorrow - Lisinopril 5 mg BID (increased from daily dosing on 9/22) - Coreg 6.25mg  BID  #Normocytic anemia: Per chart review, patient had Hb 13 in 2006 during a previous admission. Hb has been 9.6-->9.3-->10.6 during the current hospitalization. MCV 80. Will investigate etiology further with anemia panel. It is possible patient has anemia of chronic disease given the severity of his CHF without previous treatment. He does not have severe renal disease so this is unlikely. Patient may be iron deficient given his increased RDW, but usually would see a microcytic anemia.  -f/u anemia panel  #Subclinical hypothyroidism:  Patient with TSH of 6.69, Free T4 wnl. We will not treat given patient is asymptomatic and free T4 is in normal range.  #Long QTc: Resolved. Pt had long QT (506) on admission and had been started on Levaquin on 9/15 for presumed CAP at urgent care center so this was promptly stopped. This has resolved with QTc 461 on EKG 9/23.    #Previous diagnosis of DM: Apparently pt was diagnosed with diabetes at urgent care last week. However, HbA1c is 6.2%, so he is not  considered a diabetic, as >6.5% is considered diabetic. He is overweight and at risk for development of diabetes in the future. He will definitely need outpatient follow up of his blood glucose.  - Heart healthy diet  - diabetes coordinator saw patient last week and patient is interested in making lifestyle changes  #Elevated transaminases: Unclear etiology at this time. AST 428, ALT 289, GGT 141, total bili 3.2 on  admission. LFTs have been improving since admission. LFTs 9/22: AST 53, ALT 149, Total bili .9 and last GGT 132 (not repeated today). Most likely due to hepatic congestion 2/2 A/C CHF given it is improving with diuresis. EtOH abuse may be contributing, especially given close to 2:1 AST to ALT ratio and elevated GGT. Pt states he drinks only on weekends, will drink up to 2 six packs of beer by himself, last drink on ?Saturday 9/13. Denies any alcohol use during the weekdays. Patient was recently on levaquin, which can be hepatotoxic, so this should also be considered especially since transaminitis is improving in the short term after we stopped levaquin on 9/17. Hepatitis panel was negative and HIV nonreactive. - follow CMP intermittently  #CAD: Patient has h/o MI in 2006 in [place]. He has not had any chest pain, but did have one episode of dizziness overnight on 9/18, no SOB, diaphoresis, or CP with this episode. Troponin was negative x 2 and EKG without ischemic changes on admission. Lipid panel 9/18- Total cholesterol 136, TG 79, HDL 15, LDL 105  - Lipitor 20mg  daily started 9/18  - Coreg 6.25mg  BID, started 9/18  - Obtained records from hospital in [place], [place]- EF at this time was 30% with normal RV function  #Tobacco abuse: Patient smoked 1-2 packs per week for four years, but did not smoke prior to this. Quit smoking 1 week ago, no nicotine patch needed/desired. Patient is adamant that he will not smoke any longer once he is discharged.   #Presumed OSA: Pt admits to snoring and  apnea at night. Does not endorse HA, but states he easily falls asleep during the day. OSA may be cause of pulmonary HTN seen on R heart cath, which may be the cause of his RHF. Patient did have cath back in 2006 that showed normal RV function, so this is new since that time. Tried CPAP in the hospital, but it caused too much anxiety. He is willing to retry the CPAP nasal mask. -will need outpatient sleep study   #DVT PPx: Lovenox, SCDs   Dispo: Disposition is deferred at this time, awaiting improvement of current medical problems.  Anticipated discharge in approximately 1-2 day(s).   The patient does have a current PCP Windell Hummingbird, MD) and does need an [facility] hospital follow-up appointment after discharge.  The patient does not have transportation limitations that hinder transportation to clinic appointments.  .Services Needed at time of discharge: Y = Yes, Blank = No PT:   OT:   RN:   Equipment:   Other:     LOS: 7 days   Windell Hummingbird, MD 11/04/2012, 6:44 AM

## 2012-11-04 NOTE — Progress Notes (Addendum)
Patient Name: Devon [name] Date of Encounter: 11/04/2012    SUBJECTIVE:He had a relatively good night considering the events of yesterday. He complains of mild dyspnea. No chest pain.  TELEMETRY:  Occasional salvos of nonsustained VT up to 5 beats.: Filed Vitals:   11/04/12 0500 11/04/12 0600 11/04/12 0700 11/04/12 0800  BP: 156/109 149/96 167/71 145/94  Pulse:      Temp:    98.7 F (37.1 C)  TempSrc:    Oral  Resp: 19 33 27 24  Height:      Weight:  109.5 kg (241 lb 6.5 oz)    SpO2: 93% 97% 90% 95%    Intake/Output Summary (Last 24 hours) at 11/04/12 0902 Last data filed at 11/04/12 0900  Gross per 24 hour  Intake 2496.43 ml  Output   5485 ml  Net -2988.57 ml    LABS: Basic Metabolic Panel:  Recent Labs  04/54/09 0500 11/04/12 0600  NA 136 141  K 5.3* 4.1  CL 99 104  CO2 28 25  GLUCOSE 101* 105*  BUN 24* 20  CREATININE 1.38* 0.92  CALCIUM 8.8 8.5   CBC:  Recent Labs  11/04/12 0600  WBC 10.6*  HGB 15.0  HCT 42.0  MCV 80.2  PLT 300    Radiology/Studies:  No new data  Physical Exam: Blood pressure 145/94, pulse 76, temperature 98.7 F (37.1 C), temperature source Oral, resp. rate 24, height 5\' 10"  (1.778 m), weight 109.5 kg (241 lb 6.5 oz), SpO2 95.00%. Weight change: 0.4 kg (14.1 oz)   The chest is clear  No gallop is heard.  Catheter site is unremarkable.  ASSESSMENT:  1. Coronary atherosclerotic heart disease with ischemic precipitated by coronary angiography(coronary embolus). In addition severe in-stent restenosis was also treated.  2. Acute on chronic systolic heart failure. The catheterization yesterday demonstrated that the patient's LV dysfunction is out of proportion to the degree of coronary disease. The patient is only had circumflex disease over time. LAD is large and widely patent. Right coronary is nondominant. The circumflex is a dominant vessel. The prior stent and presumably the infarct territory(from 2004) included the 2 large  obtuse marginals and PDA that come off of the circumflex.  3. Hypertension poorly controlled  4. Acute kidney injury noted yesterday has resolved with hydration and diuresis despite contrast exposure.   Plan:  1. Discontinue IV nitroglycerin  2. I will leave the diuretic regimen to the heart failure team  3. Dual antiplatelet therapy for one year  4. Check troponins to get some idea of the magnitude of injury from the coronary embolus  5.transfer to telemetry  Long discussion with the patient about the complications that occurred during the diagnostic catheterization on yesterday. Selinda Eon 11/04/2012, 9:02 AM

## 2012-11-04 NOTE — Progress Notes (Signed)
CARDIAC REHAB PHASE I   PRE:  Rate/Rhythm: 67 SR with PVCs many    BP: sitting 116/62    SaO2:   MODE:  Ambulation: 1050 ft   POST:  Rate/Rhythm: 88 SR with PVCs    BP: sitting 129/90     SaO2: 100 RA  Tolerated well. No c/o. Discussed stent, Brilinta, NTG, CRPII and ex. Reviewed HF. Pt has poor capacity to pay attention for long periods of time. Seems to comprehend initial ed but loses focus after 5-10 min. Needs reiteration of HF, NTG. Sts he is somewhat interested in CRPII and will send referral to [place]. Can walk independently. [phone]  Elissa Lovett Dunbar CES, ACSM 11/04/2012 10:22 AM

## 2012-11-05 LAB — BASIC METABOLIC PANEL
CO2: 27 mEq/L (ref 19–32)
Calcium: 9.2 mg/dL (ref 8.4–10.5)
Chloride: 98 mEq/L (ref 96–112)
Creatinine, Ser: 1 mg/dL (ref 0.50–1.35)
GFR calc Af Amer: >90 mL/min (ref >90)
GFR calc non Af Amer: 85 mL/min — ABNORMAL LOW (ref >90)
Glucose, Bld: 103 mg/dL — ABNORMAL HIGH (ref 70–99)

## 2012-11-05 LAB — TROPONIN I: Troponin I: 1.23 ng/mL (ref <0.30)

## 2012-11-05 MED ORDER — CARVEDILOL 6.25 MG PO TABS: 6.2500 mg | ORAL_TABLET | Freq: Two times a day (BID) | ORAL | Status: DC

## 2012-11-05 MED ORDER — DIGOXIN 125 MCG PO TABS: 0.1250 mg | ORAL_TABLET | Freq: Every day | ORAL | Status: DC

## 2012-11-05 MED ORDER — SPIRONOLACTONE 12.5 MG HALF TABLET: 12.5000 mg | ORAL_TABLET | Freq: Every day | ORAL | Status: DC

## 2012-11-05 MED ORDER — TICAGRELOR 90 MG PO TABS: 90.0000 mg | ORAL_TABLET | Freq: Two times a day (BID) | ORAL | Status: DC

## 2012-11-05 MED ORDER — LISINOPRIL 5 MG PO TABS: 5.0000 mg | ORAL_TABLET | Freq: Two times a day (BID) | ORAL | Status: DC

## 2012-11-05 MED ORDER — ATORVASTATIN CALCIUM 20 MG PO TABS: 20.0000 mg | ORAL_TABLET | Freq: Every day | ORAL | Status: DC

## 2012-11-05 MED ORDER — FUROSEMIDE 40 MG PO TABS: 40.0000 mg | ORAL_TABLET | Freq: Every day | ORAL | Status: DC

## 2012-11-05 NOTE — Progress Notes (Signed)
Pt asleep. Fiance present and sts he has walked x2 with her without c/o. Still having PVCs at rest. Reviewed ed with fiance who was very cognizant and receptive. Gave videos for her to watch. Will continue to follow. [phone] Ethelda Chick CES,  ACSM 10:29 AM 11/05/2012

## 2012-11-05 NOTE — Progress Notes (Signed)
Utilization Review Completed Bana Borgmeyer J. Izzie Geers, RN, BSN, NCM [phone]  

## 2012-11-05 NOTE — Progress Notes (Signed)
Subjective: Patient feels much improved today. He has been up and walking and is able to walk down the hall with only mild SOB that improves with rest. Patient has been sleeping much better than he had been prior to admission. I had a long conversation with the patient and his fiance of the importance of keeping up with his health and following up with the [facility] and heart failure clinic.   Objective: Vital signs in last 24 hours: Filed Vitals:   11/04/12 1543 11/04/12 1842 11/04/12 1951 11/05/12 0606  BP: 119/77 105/86 128/65 119/70  Pulse:  65 81 60  Temp: 98.8 F (37.1 C) 98.7 F (37.1 C) 98.4 F (36.9 C) 98.2 F (36.8 C)  TempSrc: Oral Oral Oral Oral  Resp: 20 20 20 20   Height:  5\' 10"  (1.778 m)    Weight:  108.455 kg (239 lb 1.6 oz)  107.412 kg (236 lb 12.8 oz)  SpO2: 95% 98% 96% 98%  on room air  Weight change: -1.545 kg (-3 lb 6.5 oz)  Intake/Output Summary (Last 24 hours) at 11/05/12 0849 Last data filed at 11/05/12 0750  Gross per 24 hour  Intake   1171 ml  Output   3975 ml  Net  -2804 ml   General: sitting upright in chair, appears comfortable, NAD HEENT: vision grossly intact Cardiac: RRR, no rubs, murmurs or gallops Pulm: anterior lung fields clear to auscultation bilaterally, no wheezes, rales, or rhonchi Abd: soft, nontender, nondistended, BS present Ext: warm and well perfused, no pedal edema Neuro: alert and oriented X3, cranial nerves II-XII grossly intact, moving all extremities spontaneously and without difficulty; sensation grossly intact  Lab Results: Basic Metabolic Panel:  Recent Labs Lab 11/03/12 0500 11/04/12 0600  NA 136 141  K 5.3* 4.1  CL 99 104  CO2 28 25  GLUCOSE 101* 105*  BUN 24* 20  CREATININE 1.38* 0.92  CALCIUM 8.8 8.5   Liver Function Tests:  Recent Labs Lab 11/01/12 0426 11/02/12 0345  AST 91* 53*  ALT 202* 149*  ALKPHOS 96 110  BILITOT 1.3* 0.9  PROT 6.3 6.4  ALBUMIN 3.0* 3.0*   CBC:  Recent Labs Lab  11/04/12 0600  WBC 10.6*  HGB 15.0  HCT 42.0  MCV 80.2  PLT 300   Cardiac Enzymes:  Recent Labs Lab 10/30/12 1040 11/04/12 0758 11/04/12 1432  TROPONINI <0.30 2.98* 2.88*   CBG:  Recent Labs Lab 10/31/12 0543 11/01/12 0715 11/02/12 0606 11/03/12 0715 11/03/12 0957 11/03/12 1154  GLUCAP 112* 90 111* 103* 122* 120*   Studies/Results: 2D ECHO:  10/30/12  Study Conclusions - Left ventricle: The cavity size was mildly dilated. Wall thickness was normal. The estimated ejection fraction was 15%. Diffuse hypokinesis. Doppler parameters are consistent with restrictive physiology, indicative of decreased left ventricular diastolic compliance and/or increased left atrial pressure. - Mitral valve: Mild regurgitation. - Left atrium: The atrium was mildly dilated. - Right ventricle: The cavity size was mildly dilated.Systolic function was severely reduced. - Right atrium: The atrium was mildly dilated. - Pulmonary arteries: Systolic pressure was mildly increased. PA peak pressure: 35mm Hg (S). - Pericardium, extracardiac: A trivial pericardial effusion was identified.  Heart Cath 11/03/2012 First cath- IMPRESSIONS: 1. 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  2. Luminal irregularities throughout the LAD and right coronary but no significant obstruction  3. Moderate to moderately severe pulmonary hypertension  4. Elevated left ventricular end-diastolic pressure and pulmonary capillary wedge  pressure  5. Left ventricular systolic dysfunction out of proportion to the degree of coronary disease. Echo LVEF 15%  6. When the patient awakened from sedation he was complaining of chest pain. This is after we had broken scrub and the patient was in the process of being moved to a stretcher.  RECOMMENDATION: 1. Because of chest pain I decided to re\re looked at the patient's coronaries. Please see the separate report.  Second  Cath- IMPRESSIONS: 1. Likely catheter induced thrombotic or atherosclerotic debris to the second obtuse marginal. This occurred during the diagnostic procedure.  2. Attempt at recanalization of the second obtuse marginal was not very successful.  3. Drug-eluting stent implantation for 90% in-stent restenosis of the mid circumflex reducing the obstruction to less than 10% with TIMI grade 3 flow.  RECOMMENDATION: 1. Bivalirudin continuous infusion for 6 hours at .25 mg/kg/hr rate.  2. IV nitroglycerin x 24 hours  3. IV hydration due to contrast load in setting of increasing creatinine at the time of catheter  4. Aspirin and Brilinta.  Medications: I have reviewed the patient's current medications. Scheduled Meds: . aspirin EC  81 mg Oral Daily  . atorvastatin  20 mg Oral q1800  . carvedilol  6.25 mg Oral BID WC  . digoxin  0.125 mg Oral Daily  . enoxaparin (LOVENOX) injection  40 mg Subcutaneous Q24H  . furosemide  40 mg Intravenous BID  . lisinopril  5 mg Oral BID  . Ticagrelor  90 mg Oral BID   Continuous Infusions:   PRN Meds:.acetaminophen, albuterol, chlorpheniramine-HYDROcodone, ipratropium  Assessment/Plan:  Acute on chronic systolic CHF with right sided HF: Heart cath done a few days ago showed pulmonary HTN with poor left ventricular function (EF15%) with in stent throbosis to LAD, DES placed. Procedure complicated by embolus w/ subsequent occlusion of obtuse marginal artery, troponins increased x 3. Today, patient is without chest pain and he reports his breathing and sleep are much improved since diuresis was initiated, he is down 19lbs since admission. Patient is ready to go home. He agrees to follow up with heart failure clinic.  - transition to PO lasix today: Lasix 40mg  po daily - restart spironolactone 12.5mg  daily today - lisinopril 5mg  BID - coreg 6.25mg  BID - digoxin .125mg  daily - lipitor 20mg  daily - continue Brilinta and ASA x 1 year - cardiac rehab -K is stable  at 4.0 today - Cr stable at 1.00 (eGFR >90)  #Possible bronchopneumonia: On admission, patient had been previously started on levaquin for presumed PNA by urgent care. His CXR showed possible bronchopneumonia vs fluid overload. Pt remains afebrile, VSS, no leukocytosis. Breathing is much improved now. He was started on Rocephin in the ED and completed 5 day course of Doxy and Amoxicillin.  On chest CT, there was concern for possible pulmonary nodules that will need to be reevaluated by repeat CT scan in 3-58mo. Patient does have hx of smoking 1-2 packs per week for four years, so may benefit from outpatient PFTs if he continues having SOB despite control of his HF. -duonebs q4h prn shortness of breath -pulse ox  -incentive spirometry  -repeat CT chest in 3-6 months per rads recs due to pulmonary nodules  -tussionex prn cough  -consider PFTs as outpatient in 3-6 weeks  #HTN: Patient's BP elevated on admission. He has never been on any antihypertensives previously. Lisinopril and Coreg were started on admission. With the addition of the Lasix, his blood pressure has been better controlled. However, patient's lasix  and lisinopril held yesterday for the heart cath, so BPs slightly elevated today. All meds restarted today. - Lasix 40mg  IV BID-->transitioned to Lasix 40mg  PO daily today - Lisinopril 5 mg BID  - Coreg 6.25mg  BID - spironolactone 12.5mg  daily  #Normocytic anemia: Per chart review, patient had Hb 13 in 2006 during a previous admission. Hb has been 9.6-->9.3-->10.6 during the current hospitalization. MCV 80. Anemia panel showed Fe level low at 30 with normal ferritin and TIBC. Folate and B12 normal. Reticulocyte index 2.6, which is appropriate. Likely anemia of chronic disease given his severe CHF, low Fe and normal ferritin level.   #Subclinical hypothyroidism:  Patient with TSH of 6.69, Free T4 wnl. We will not treat given patient is asymptomatic and free T4 is in normal range.  #Long  QTc: Resolved. Pt had long QT (506) on admission and had been started on Levaquin on 9/15 for presumed CAP at urgent care center so this was promptly stopped. This has resolved with QTc 461 on EKG 9/23.    #Previous diagnosis of DM: Apparently pt was diagnosed with diabetes at urgent care last week. However, HbA1c is 6.2%, so he is not considered a diabetic, as >6.5% is considered diabetic. He is overweight and at risk for development of diabetes in the future. He will definitely need outpatient follow up of his blood glucose.  - Heart healthy diet  - diabetes coordinator saw patient last week and patient is interested in making lifestyle changes  #Elevated transaminases: Unclear etiology at this time. AST 428, ALT 289, GGT 141, total bili 3.2 on admission. LFTs have been improving since admission. LFTs 9/22: AST 53, ALT 149, Total bili .9 and last GGT 132 (not repeated today). Most likely due to hepatic congestion 2/2 A/C CHF given it is improving with diuresis. EtOH abuse may be contributing, especially given close to 2:1 AST to ALT ratio and elevated GGT. Pt states he drinks only on weekends, will drink up to 2 six packs of beer by himself, last drink on ?Saturday 9/13. Denies any alcohol use during the weekdays. Patient was recently on levaquin, which can be hepatotoxic, so this should also be considered especially since transaminitis is improving in the short term after we stopped levaquin on 9/17. Hepatitis panel was negative and HIV nonreactive. - follow CMP intermittently  #CAD: Patient has h/o MI in 2006 in [place]. He has not had any chest pain, but did have one episode of dizziness overnight on 9/18, no SOB, diaphoresis, or CP with this episode. Troponin was negative x 2 and EKG without ischemic changes on admission. Lipid panel 9/18- Total cholesterol 136, TG 79, HDL 15, LDL 105. On day of cath, patient had complication of NSTEMI 2/2 emboli to OM2 (trop increased x 3). - Lipitor 20mg  daily started  9/18  - Coreg 6.25mg  BID, started 9/18  - ASA/Brilinta x 1 year (s/p DES to LAD) - Obtained records from hospital in [place], [place]- EF at this time was 30% with normal RV function  #Tobacco abuse: Patient smoked 1-2 packs per week for four years, but did not smoke prior to this. Quit smoking 1 week ago, no nicotine patch needed/desired. Patient is adamant that he will not smoke any longer once he is discharged.   #Presumed OSA: Pt admits to snoring and apnea at night. Does not endorse HA, but states he easily falls asleep during the day. OSA may be cause of pulmonary HTN seen on R heart cath, which may be the  cause of his RHF. Patient did have cath back in 2006 that showed normal RV function, so this is new since that time. Tried CPAP in the hospital, but it caused too much anxiety. He is willing to retry the CPAP nasal mask. -will need outpatient sleep study   #DVT PPx: Lovenox, SCDs   Dispo: Discharge today as long as cardiology/HF team agree.  The patient does have a current PCP Windell Hummingbird, MD) and does need an [facility] hospital follow-up appointment after discharge.  The patient does not have transportation limitations that hinder transportation to clinic appointments.  .Services Needed at time of discharge: Y = Yes, Blank = No PT:   OT:   RN:   Equipment:   Other:     LOS: 8 days   Windell Hummingbird, MD 11/05/2012, 8:49 AM

## 2012-11-05 NOTE — Progress Notes (Signed)
Nutrition Education Note  RD consulted for nutrition education regarding new onset CHF.  RD provided "Heart Failure Nutrition Therapy" handout from the Academy of [employer]. Provided examples on ways to decrease sodium intake in diet. Discouraged intake of processed foods and use of salt shaker. Encouraged fresh fruits and vegetables as well as whole grain sources of carbohydrates to maximize fiber intake.   Pt ready for discharge and not very interested in education. Pt states he reviewed handout on his own and has no questions or concerns. States he will cut back on fried foods and bacon and will try to eat whole grains.  Expect fair compliance.  Body mass index is 33.98 kg/(m^2). Pt meets criteria for Obesity based on current BMI.  Labs and medications reviewed.  RD contact information provided. Encouraged pt to call with any additional questions, concerns or dietary needs.   Ian Malkin RD, LDN Inpatient Clinical Dietitian Pager: [phone] After Hours Pager: [phone]

## 2012-11-05 NOTE — Progress Notes (Signed)
Pt being dc to home with signigicant other, pt given dc instructions, follow up appointments, and medications reviewed, pt verbalized understanding, pt leaving with fiance pt stable

## 2012-11-05 NOTE — Progress Notes (Signed)
Advanced Heart Failure Rounding Note   Subjective:    Devon [name] is a 52 year old man with history of DM2 (diagnosed at urgent care last week), HTN (diagnosed at urgent care last week), ETOH, CAD s/p anterior MI 2004 [place] in 2004 with cath and stent x 2 placed at that time. Snores a lot.   Admitted with ADHF. 10/30/12 ECHO EF 15% with diffuse HK (LVIDd 61 mm) RV mildly dilated severely   11/03/12 R & LHC  S/p PCI/stenting of LCX with embolization to OM-1 RA 12 RV 51/7 PA 50/25 PCWP 23 LVEDP 21 Fick 4.0 Thermo 4.4  Yesterday he was started back on lisinopril 5 mg bid and continued on IV lasix. Weight down 3 pounds. Overall weight down 19 pounds. Walked 1050 feet maintaining oxygen saturations 100%.   Denies SOB/PND/Orthopnea. BP soft with systolics in 90s.    Objective:   Weight Range:  Vital Signs:   Temp:  [98.2 F (36.8 C)-98.8 F (37.1 C)] 98.2 F (36.8 C) (09/25 0606) Pulse Rate:  [60-81] 60 (09/25 0606) Resp:  [18-20] 20 (09/25 0606) BP: (93-128)/(59-86) 93/59 mmHg (09/25 0934) SpO2:  [95 %-98 %] 98 % (09/25 0606) Weight:  [236 lb 12.8 oz (107.412 kg)-239 lb 1.6 oz (108.455 kg)] 236 lb 12.8 oz (107.412 kg) (09/25 0606) Last BM Date: 11/05/12  Weight change: Filed Weights   11/04/12 0600 11/04/12 1842 11/05/12 0606  Weight: 241 lb 6.5 oz (109.5 kg) 239 lb 1.6 oz (108.455 kg) 236 lb 12.8 oz (107.412 kg)    Intake/Output:   Intake/Output Summary (Last 24 hours) at 11/05/12 1004 Last data filed at 11/05/12 0750  Gross per 24 hour  Intake   1171 ml  Output   3575 ml  Net  -2404 ml     Physical Exam: General:  Sitting in chair. Well appearing. No resp difficulty HEENT: normal Neck: supple. JVP 6-7. Carotids 2+ bilat; no bruits. No lymphadenopathy or thryomegaly appreciated. Cor: PMI nondisplaced. Regular rate & rhythm. No rubs, gallops or murmurs. Lungs: clear Abdomen: soft, nontender, nondistended. No hepatosplenomegaly. No bruits or masses. Good bowel  sounds. Extremities: no cyanosis, clubbing, rash, edema Neuro: alert & orientedx3, cranial nerves grossly intact. moves all 4 extremities w/o difficulty. Affect pleasant  Telemetry:  SR 70s  Labs: Basic Metabolic Panel:  Recent Labs Lab 10/31/12 0605 11/01/12 0426 11/02/12 0345 11/03/12 0500 11/04/12 0600  NA 133* 134* 136 136 141  K 3.9 3.4* 3.8 5.3* 4.1  CL 94* 96 99 99 104  CO2 25 28 29 28 25   GLUCOSE 96 125* 112* 101* 105*  BUN 26* 27* 24* 24* 20  CREATININE 1.25 1.23 1.19 1.38* 0.92  CALCIUM 9.3 8.6 8.7 8.8 8.5    Liver Function Tests:  Recent Labs Lab 10/30/12 0450 10/31/12 0605 11/01/12 0426 11/02/12 0345  AST 201* 203* 91* 53*  ALT 275* 311* 202* 149*  ALKPHOS 89 110 96 110  BILITOT 1.7* 1.8* 1.3* 0.9  PROT 6.5 7.6 6.3 6.4  ALBUMIN 3.2* 3.6 3.0* 3.0*   No results found for this basename: LIPASE, AMYLASE,  in the last 168 hours No results found for this basename: AMMONIA,  in the last 168 hours  CBC:  Recent Labs Lab 11/04/12 0600  WBC 10.6*  HGB 15.0  HCT 42.0  MCV 80.2  PLT 300    Cardiac Enzymes:  Recent Labs Lab 10/30/12 1040 11/04/12 0758 11/04/12 1432  TROPONINI <0.30 2.98* 2.88*    BNP: BNP (last 3  results)  Recent Labs  10/26/12 1927 10/28/12 1225  PROBNP 6306.0* 4543.0*     Other results:   Imaging: No results found.   Medications:     Scheduled Medications: . aspirin EC  81 mg Oral Daily  . atorvastatin  20 mg Oral q1800  . carvedilol  6.25 mg Oral BID WC  . digoxin  0.125 mg Oral Daily  . enoxaparin (LOVENOX) injection  40 mg Subcutaneous Q24H  . furosemide  40 mg Intravenous BID  . lisinopril  5 mg Oral BID  . Ticagrelor  90 mg Oral BID    Infusions:    PRN Medications: acetaminophen, albuterol, chlorpheniramine-HYDROcodone, ipratropium   Assessment:   1. A/C Systolic Heart Failure EF 15% with biventricular HF  2. CAD- s/p anterior MI MI with stents 2004  3. HTN  4. DM  5. Pulmonary  nodules - repeat CT in 3 months  6. Former tobacco  7. ETOH use  8. OSA, refusing CPAP      Plan/Discussion:   Volume status improved. Weight down another 3 pounds. Overall weight down 19 pounds. Stop IV lasix and transition to  Lasix 40 mg daily and add 12.5 mg spironolactone daily.  Continue carvedilol 6.25 mg twice a day. Continue lisinopril 5 mg twice a day.   He has received extensive HF education regarding daily weights, medication compliance, and low salt food choices.   Follow up scheduled in HF clinic 11/11/12 at 3:20  CLEGG,AMY, NP-C10:04 AM Length of Stay: 8 11/05/2012, 10:04 AM Advanced Heart Failure Team Pager [phone] (M-F; 7a - 4p)  Please contact [facility] for night-coverage after hours (4p -7a ) and weekends on [email or web address]   Patient seen and examined with Tonye Becket, NP. We discussed all aspects of the encounter. I agree with the assessment and plan as stated above. Looks good. Can go home today with close f/u. Discussed need for daily weights and f/u with HF clinic.  Cardiac meds on d/c  Lasix 40 daily Spiro 12.5 daily Digoxin 0.125 daily Carvedilol 6.25 bid Lisinopril 5 bid ASA 81 Brillinta 90 bid  Follow up scheduled in HF clinic 11/11/12 at 3:20  Daniel Bensimhon,MD 10:50 AM

## 2012-11-11 ENCOUNTER — Ambulatory Visit (HOSPITAL_COMMUNITY)
Admit: 2012-11-11 | Discharge: 2012-11-11 | Disposition: A | Discharge status: Home / Self Care | Payer: BC Managed Care – PPO | Attending: Internal Medicine | Admitting: Internal Medicine

## 2012-11-11 ENCOUNTER — Encounter (HOSPITAL_COMMUNITY): Payer: Self-pay

## 2012-11-11 VITALS — BP 102/66 | HR 67 | Wt 240.8 lb

## 2012-11-11 DIAGNOSIS — I5023 Acute on chronic systolic (congestive) heart failure: Secondary | ICD-10-CM

## 2012-11-11 DIAGNOSIS — I5032 Chronic diastolic (congestive) heart failure: Secondary | ICD-10-CM | POA: Insufficient documentation

## 2012-11-11 DIAGNOSIS — I5022 Chronic systolic (congestive) heart failure: Secondary | ICD-10-CM | POA: Insufficient documentation

## 2012-11-11 DIAGNOSIS — Z79899 Other long term (current) drug therapy: Secondary | ICD-10-CM | POA: Insufficient documentation

## 2012-11-11 DIAGNOSIS — Z87891 Personal history of nicotine dependence: Secondary | ICD-10-CM | POA: Insufficient documentation

## 2012-11-11 DIAGNOSIS — F17200 Nicotine dependence, unspecified, uncomplicated: Secondary | ICD-10-CM

## 2012-11-11 DIAGNOSIS — I2510 Atherosclerotic heart disease of native coronary artery without angina pectoris: Secondary | ICD-10-CM | POA: Insufficient documentation

## 2012-11-11 DIAGNOSIS — I509 Heart failure, unspecified: Secondary | ICD-10-CM | POA: Insufficient documentation

## 2012-11-11 DIAGNOSIS — Z7982 Long term (current) use of aspirin: Secondary | ICD-10-CM | POA: Insufficient documentation

## 2012-11-11 DIAGNOSIS — I252 Old myocardial infarction: Secondary | ICD-10-CM | POA: Insufficient documentation

## 2012-11-11 DIAGNOSIS — Z720 Tobacco use: Secondary | ICD-10-CM

## 2012-11-11 DIAGNOSIS — I10 Essential (primary) hypertension: Secondary | ICD-10-CM | POA: Insufficient documentation

## 2012-11-11 DIAGNOSIS — E119 Type 2 diabetes mellitus without complications: Secondary | ICD-10-CM | POA: Insufficient documentation

## 2012-11-11 DIAGNOSIS — Z9861 Coronary angioplasty status: Secondary | ICD-10-CM | POA: Insufficient documentation

## 2012-11-11 LAB — BASIC METABOLIC PANEL
BUN: 18 mg/dL (ref 6–23)
CO2: 19 mEq/L (ref 19–32)
Calcium: 9.1 mg/dL (ref 8.4–10.5)
Creatinine, Ser: 1 mg/dL (ref 0.50–1.35)
GFR calc Af Amer: >90 mL/min (ref >90)
GFR calc non Af Amer: 85 mL/min — ABNORMAL LOW (ref >90)
Sodium: 133 mEq/L — ABNORMAL LOW (ref 135–145)

## 2012-11-11 LAB — PRO B NATRIURETIC PEPTIDE: Pro B Natriuretic peptide (BNP): 1451 pg/mL — ABNORMAL HIGH (ref 0–125)

## 2012-11-11 MED ORDER — CARVEDILOL 6.25 MG PO TABS: 9.3750 mg | ORAL_TABLET | Freq: Two times a day (BID) | ORAL | Status: DC

## 2012-11-11 NOTE — Progress Notes (Signed)
Patient ID: Devon [name], male   DOB: 11-07-1960, 52 y.o.   MRN: [id number]  Weight Range  236-239 pounds  Baseline proBNP   PCP: Dr Devon [name]   HPI: Devon [name] is a 52 year old man with history of DM2 (diagnosed at urgent care ), HTN (diagnosed at urgent care last week), ETOH, CAD s/p anterior MI 2004 [place] in 2004 with cath and stent x 2 placed at that time.    Admitted to [facility] 9/17/ through 11/03/12 with acute/chronic systolic heart failure. Had cath as noted below with stent. 10/30/12 ECHO 15%. Started on carvedilol 6.25 mg twice daily, spiro 12.5 mg daily, lasix 40 mg daily, and lisinopril 5 mg bid.  D/C weight 236 pounds.   11/03/12 R & LHC S/p PCI/stenting of LCX with embolization to OM-1  RA 12  RV 51/7  PA 50/25  PCWP 23  LVEDP 21  Fick 4.0  Thermo 4.4  Labs 11/05/12 Creatinine 1.0 K 4.0   He returns for post hospital follow up. Denies SOB/PND/Orthopnea/CP. Does admit to dyspnea with steps.  Snoring has resolved post hospital. Weight at home 236-239 pounds. Compliant with medications. Able to walk 15-20 minutes daily. Following low salt diet. Limiting fluid intake to < 2 liters.    FH: Father ,brother,sister HTN  SH: Former smoker quit 10/2012. Does not drink alcohol  ROS: All systems negative except as listed in HPI, PMH and Problem List.  Past Medical History  Diagnosis Date  . Coronary artery disease   . Hypertension   . CHF (congestive heart failure)   . Diabetes mellitus without complication     Current Outpatient Prescriptions  Medication Sig Dispense Refill  . aspirin EC 81 MG tablet Take 81 mg by mouth daily.      Marland Kitchen atorvastatin (LIPITOR) 20 MG tablet Take 1 tablet (20 mg total) by mouth daily at 6 PM.  30 tablet  6  . carvedilol (COREG) 6.25 MG tablet Take 1 tablet (6.25 mg total) by mouth 2 (two) times daily with a meal.  60 tablet  6  . dextromethorphan-guaiFENesin (MUCINEX DM) 30-600 MG per 12 hr tablet Take 1 tablet by mouth every 12 (twelve) hours.  14  tablet  0  . digoxin (LANOXIN) 0.125 MG tablet Take 1 tablet (0.125 mg total) by mouth daily.  30 tablet  6  . furosemide (LASIX) 40 MG tablet Take 1 tablet (40 mg total) by mouth daily.  30 tablet  6  . lisinopril (PRINIVIL,ZESTRIL) 5 MG tablet Take 1 tablet (5 mg total) by mouth 2 (two) times daily.  60 tablet  6  . spironolactone (ALDACTONE) 12.5 mg TABS tablet Take 0.5 tablets (12.5 mg total) by mouth daily.  15 tablet  6  . Ticagrelor (BRILINTA) 90 MG TABS tablet Take 1 tablet (90 mg total) by mouth 2 (two) times daily.  60 tablet  6   No current facility-administered medications for this encounter.     PHYSICAL EXAM: Filed Vitals:   11/11/12 1436  BP: 102/66  Pulse: 67  Weight: 240 lb 12.8 oz (109.226 kg)  SpO2: 98%    General:  Well appearing. No resp difficulty HEENT: normal Neck: supple. JVP flat. Carotids 2+ bilaterally; no bruits. No lymphadenopathy or thryomegaly appreciated. Cor: PMI normal. Regular rate & rhythm. No rubs, gallops or murmurs. Lungs: clear Abdomen: soft, nontender, nondistended. No hepatosplenomegaly. No bruits or masses. Good bowel sounds. Extremities: no cyanosis, clubbing, rash, edema Neuro: alert & orientedx3, cranial nerves grossly intact.  Moves all 4 extremities w/o difficulty. Affect pleasant.   ASSESSMENT & PLAN: 1. Chronic Systolic Heart Failure ECHO 10/30/12 EF 15%  NYHA II. Dyspnea with steps Volume status stable . Dry weight 236 pounds. Instructed to take an extra lasix if weight is 240 pounds or greater.  -Continue lasix 40 mg daily -Increase carvedilol to 9.375 mg twice a day. Continue digoxin 0.125 mg daily -Continue lisinopril 5 mg twice a day -Continue spironolactone 12.5 mg daily -Reinforced daily weights, medication compliance, and low salt food choices. Provided weight chart to record daily weights.   Check BMET > 11/11/12 Creatinine  1.0, K 4.8, Pro BNP 1451 Reassess ability to work at next visit.  Will need repeat ECHO aft HF  meds optimized . Mid December   2. CAD H/O MI in [place]. S/P PCI/stenting of LCX with embolization to OM-1  No evidence of ischemia. Continue brillinta and lipitor    3. Former Smoker. Quit 10/2012 Congratulated on smoking cessation  4. HTN  Stable continue current regimen  5. Snores Resolved    Follow up in 2 weeks. Anticipate increasing beta blocker.   CLEGG,AMYNP-C 5:10 PM

## 2012-11-11 NOTE — Patient Instructions (Addendum)
Follow up in 2 weeks  Take carvedilol 9.375 mg twice a day  Take an extra 40 mg of lasix if your weight is 240 pounds or greater.  Do the following things EVERYDAY: 1) Weigh yourself in the morning before breakfast. Write it down and keep it in a log. 2) Take your medicines as prescribed 3) Eat low salt foods-Limit salt (sodium) to 2000 mg per day.  4) Stay as active as you can everyday 5) Limit all fluids for the day to less than 2 liters

## 2012-11-12 ENCOUNTER — Emergency Department (HOSPITAL_COMMUNITY)
Admission: EM | Admit: 2012-11-12 | Discharge: 2012-11-12 | Disposition: A | Discharge status: Home / Self Care | Payer: BC Managed Care – PPO | Attending: Emergency Medicine | Admitting: Emergency Medicine

## 2012-11-12 ENCOUNTER — Telehealth (HOSPITAL_COMMUNITY): Payer: Self-pay | Admitting: *Deleted

## 2012-11-12 ENCOUNTER — Encounter (HOSPITAL_COMMUNITY): Payer: Self-pay | Admitting: *Deleted

## 2012-11-12 DIAGNOSIS — T464X5A Adverse effect of angiotensin-converting-enzyme inhibitors, initial encounter: Secondary | ICD-10-CM

## 2012-11-12 DIAGNOSIS — I2510 Atherosclerotic heart disease of native coronary artery without angina pectoris: Secondary | ICD-10-CM | POA: Insufficient documentation

## 2012-11-12 DIAGNOSIS — Z79899 Other long term (current) drug therapy: Secondary | ICD-10-CM | POA: Insufficient documentation

## 2012-11-12 DIAGNOSIS — Z7982 Long term (current) use of aspirin: Secondary | ICD-10-CM | POA: Insufficient documentation

## 2012-11-12 DIAGNOSIS — I509 Heart failure, unspecified: Secondary | ICD-10-CM | POA: Insufficient documentation

## 2012-11-12 DIAGNOSIS — I10 Essential (primary) hypertension: Secondary | ICD-10-CM | POA: Insufficient documentation

## 2012-11-12 DIAGNOSIS — E119 Type 2 diabetes mellitus without complications: Secondary | ICD-10-CM | POA: Insufficient documentation

## 2012-11-12 DIAGNOSIS — T783XXA Angioneurotic edema, initial encounter: Secondary | ICD-10-CM | POA: Insufficient documentation

## 2012-11-12 DIAGNOSIS — T448X5A Adverse effect of centrally-acting and adrenergic-neuron-blocking agents, initial encounter: Secondary | ICD-10-CM | POA: Insufficient documentation

## 2012-11-12 DIAGNOSIS — Z9861 Coronary angioplasty status: Secondary | ICD-10-CM | POA: Insufficient documentation

## 2012-11-12 DIAGNOSIS — Z87891 Personal history of nicotine dependence: Secondary | ICD-10-CM | POA: Insufficient documentation

## 2012-11-12 MED ORDER — PREDNISONE 20 MG PO TABS
40.0000 mg | ORAL_TABLET | Freq: Once | ORAL | Status: AC
  Administered 2012-11-12: 40 mg via ORAL
  Filled 2012-11-12: qty 2

## 2012-11-12 MED ORDER — PREDNISONE 20 MG PO TABS: 40.0000 mg | ORAL_TABLET | Freq: Every day | ORAL | Status: DC

## 2012-11-12 MED ORDER — FAMOTIDINE 20 MG PO TABS
20.0000 mg | ORAL_TABLET | Freq: Once | ORAL | Status: AC
  Administered 2012-11-12: 20 mg via ORAL
  Filled 2012-11-12: qty 1

## 2012-11-12 NOTE — Telephone Encounter (Signed)
She called concerned that pt awoke with the left side of his bottom lip swollen, tongue is not swollen per Tonye Becket, NP could be from Lisinopril have pt stop med now, take benadryl if swelling spreads or tongue swells will go to ER, she is agreeable

## 2012-11-12 NOTE — ED Notes (Signed)
Pt reports waking up this am with swelling to left side lower leg. Pt takes lisinopril, pt took it this am 0800. Pt called dr office and was told to stop taking it and did take benadryl 25mg  pta. Denies any swelling to tongue or throat. Airway intact at triage.

## 2012-11-12 NOTE — ED Notes (Signed)
Pt here with c/o swelling to lower lip that started @ 4 am.  Pt took Benadryl 25 mg PO @ 9 am after calling PCP.  Pt denies any problems with respirations or swallowing.  Advises his throat is normal and he swallows easy.

## 2012-11-12 NOTE — ED Provider Notes (Signed)
CSN: [id number]     Arrival date & time 11/12/12  4098 History   First MD Initiated Contact with Patient 11/12/12 1022     Chief Complaint  Patient presents with  . Allergic Reaction   (Consider location/radiation/quality/duration/timing/severity/associated sxs/prior Treatment) HPI Comments: Patients with history of congestive heart failure, recent admission for same and discharged on 11/05/12. Patient was started on lisinopril on 09/25. Patient awoke this morning at about 4 AM and noted swelling to his left lower lip. He did not have any tongue swelling or difficulty breathing. No lightheadedness, nausea, diarrhea. No other new exposures. Patient's family called his doctor this morning and was told to come to the emergency department. He received one Benadryl prior to arrival. Onset of symptoms acute. Course is stable. Nothing makes symptoms better or worse.  Patient is a 52 y.o. male presenting with allergic reaction. The history is provided by the patient, medical records and the spouse.  Allergic Reaction Presenting symptoms: no difficulty swallowing, no rash and no wheezing     Past Medical History  Diagnosis Date  . Coronary artery disease   . Hypertension   . CHF (congestive heart failure)   . Diabetes mellitus without complication    Past Surgical History  Procedure Laterality Date  . Coronary stents     Family History  Problem Relation Age of Onset  . Hypertension Father     father, brother, sister  . Diabetes      sister  . Coronary artery disease Sister     sister  . Breast cancer Mother   . Breast cancer Sister    History  Substance Use Topics  . Smoking status: Former Smoker -- 0.25 packs/day    Types: Cigarettes  . Smokeless tobacco: Not on file  . Alcohol Use: Yes    Review of Systems  Constitutional: Negative for fever.  HENT: Positive for facial swelling (left lower lip). Negative for trouble swallowing.   Eyes: Negative for redness.  Respiratory:  Negative for shortness of breath, wheezing and stridor.   Cardiovascular: Negative for chest pain.  Gastrointestinal: Negative for nausea and vomiting.  Musculoskeletal: Negative for myalgias.  Skin: Negative for rash.  Neurological: Negative for light-headedness.  Psychiatric/Behavioral: Negative for confusion.    Allergies  Lisinopril  Home Medications   Current Outpatient Rx  Name  Route  Sig  Dispense  Refill  . aspirin EC 81 MG tablet   Oral   Take 81 mg by mouth daily.         Marland Kitchen atorvastatin (LIPITOR) 20 MG tablet   Oral   Take 1 tablet (20 mg total) by mouth daily at 6 PM.   30 tablet   6   . carvedilol (COREG) 6.25 MG tablet   Oral   Take 1.5 tablets (9.375 mg total) by mouth 2 (two) times daily with a meal.   90 tablet   6   . digoxin (LANOXIN) 0.125 MG tablet   Oral   Take 1 tablet (0.125 mg total) by mouth daily.   30 tablet   6   . furosemide (LASIX) 40 MG tablet   Oral   Take 1 tablet (40 mg total) by mouth daily.   30 tablet   6   . spironolactone (ALDACTONE) 12.5 mg TABS tablet   Oral   Take 0.5 tablets (12.5 mg total) by mouth daily.   15 tablet   6   . Ticagrelor (BRILINTA) 90 MG TABS tablet   Oral  Take 1 tablet (90 mg total) by mouth 2 (two) times daily.   60 tablet   6    BP 113/62  Pulse 72  Temp(Src) 98.6 F (37 C) (Oral)  Resp 22  SpO2 96% Physical Exam  Nursing note and vitals reviewed. Constitutional: He appears well-developed and well-nourished.  HENT:  Head: Normocephalic and atraumatic.  Mouth/Throat: Oropharynx is clear and moist.  Isolated left lower lip swelling. No tongue swelling. Airway is patent.  Eyes: Conjunctivae are normal. Right eye exhibits no discharge. Left eye exhibits no discharge.  Neck: Normal range of motion. Neck supple.  Cardiovascular: Normal rate and regular rhythm.   Pulmonary/Chest: Effort normal and breath sounds normal. No respiratory distress. He has no wheezes. He has no rales.   Abdominal: Soft. There is no tenderness.  Neurological: He is alert.  Skin: Skin is warm and dry.  Psychiatric: He has a normal mood and affect.    ED Course  Procedures (including critical care time) Labs Review Labs Reviewed - No data to display Imaging Review No results found.  10:45 AM Patient seen and examined. Medications ordered. Pt stable. D/w Dr. Bebe Shaggy. Will monitor.   Vital signs reviewed and are as follows: Filed Vitals:   11/12/12 1011  BP: 113/62  Pulse: 72  Temp: 98.6 F (37 C)  Resp: 22   11:33 AM Exam unchanged, not worsening.   1:41 PM Exam continues to be stable. Will d/c to home. Patient urged to return with worsening symptoms or other concerns. Patient verbalized understanding and agrees with plan.   He is to f/u with PCP as planned tomorrow.    MDM   1. ACE inhibitor-aggravated angioedema, initial encounter    Angioedema, c/w ACE induced etiology. No airway compromise. Swelling is stable. Steroid/antihistamines as precaution. Pt appears well, has f/u to discuss BP medications tomorrow.     Renne Crigler, PA-C 11/12/12 1343

## 2012-11-13 ENCOUNTER — Encounter: Payer: Self-pay | Admitting: Internal Medicine

## 2012-11-13 ENCOUNTER — Ambulatory Visit (INDEPENDENT_AMBULATORY_CARE_PROVIDER_SITE_OTHER): Payer: BC Managed Care – PPO | Admitting: Internal Medicine

## 2012-11-13 VITALS — BP 105/72 | HR 72 | Temp 97.6°F | Ht 70.0 in | Wt 241.5 lb

## 2012-11-13 DIAGNOSIS — I10 Essential (primary) hypertension: Secondary | ICD-10-CM

## 2012-11-13 DIAGNOSIS — R040 Epistaxis: Secondary | ICD-10-CM

## 2012-11-13 DIAGNOSIS — I5022 Chronic systolic (congestive) heart failure: Secondary | ICD-10-CM

## 2012-11-13 DIAGNOSIS — Z720 Tobacco use: Secondary | ICD-10-CM

## 2012-11-13 DIAGNOSIS — R918 Other nonspecific abnormal finding of lung field: Secondary | ICD-10-CM

## 2012-11-13 DIAGNOSIS — F17200 Nicotine dependence, unspecified, uncomplicated: Secondary | ICD-10-CM

## 2012-11-13 MED ORDER — OXYMETAZOLINE HCL 0.05 % NA SOLN: NASAL | Status: DC

## 2012-11-13 NOTE — Assessment & Plan Note (Signed)
  Assessment: Progress toward smoking cessation:    Barriers to progress toward smoking cessation:    Comments: The patient has continued to obtain from smoking since his hospitalization, and notes no cravings  Plan: Instruction/counseling given:  I commended patient for quitting and reviewed strategies for preventing relapses. Educational resources provided:    Self management tools provided:    Medications to assist with smoking cessation:  None Patient agreed to the following self-care plans for smoking cessation:    Other plans: Recheck at next visit

## 2012-11-13 NOTE — Assessment & Plan Note (Signed)
The patient's heart failure seems well controlled, with no overt symptoms, and home weight log showing consistency and a slight downtrend.  The patient is under the excellent care at the CHF clinic, and we appreciate their assistance. The patient discontinued lisinopril due to angioedema at a recent ED visit. -Continue Coreg, digoxin, Lasix, spironolactone -Given inability to tolerate lisinopril, the patient may benefit from hydralazine and nitrate, but as the blood pressure is stable today, will defer to CHF clinic

## 2012-11-13 NOTE — Assessment & Plan Note (Signed)
BP Readings from Last 3 Encounters:  11/13/12 105/72  11/12/12 112/81  11/11/12 102/66    Lab Results  Component Value Date   NA 133* 11/11/2012   K 4.8 11/11/2012   CREATININE 1.00 11/11/2012    Assessment: Blood pressure control: controlled Progress toward BP goal:  at goal Comments: Well-controlled  Plan: Medications:  continue current medications Educational resources provided:   Self management tools provided:

## 2012-11-13 NOTE — Assessment & Plan Note (Addendum)
The patient notes a slow, left-sided nosebleed for the past several hours, which has not responded to application of manual pressure. 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  For treatment of this, we have only gauze sponges in our clinic. -Patient declines nasal packing with gauze sponge -Instead, I have prescribed outpatient oxymetazoline nasal spray, to be used in conjunction with nasal pressure -If bleeding persists, patient instructed to present to urgent care, which would likely have other modalities such as nasal trumpet

## 2012-11-13 NOTE — Patient Instructions (Addendum)
General Instructions: For your nosebleed, we have prescribed Afrin 12-hr nasal spray (aka Oxymetazoline).  Spray 2 sprays in your left nostril, then apply pressure to your nostrils, and lean forward.  If the bleeding does not stop, please seek care in Urgent Care for nasal packing.  We are scheduling you for a CT scan in February 2014, to follow up the lung nodules seen during your hospitalization.  Congratulations on quitting smoking!!  Please return for a follow-up visit in 1 month.   Treatment Goals:  Goals (1 Years of Data) as of 11/13/12   None      Progress Toward Treatment Goals:  Treatment Goal 11/13/2012  Blood pressure at goal    Self Care Goals & Plans:  Self Care Goal 11/13/2012  Manage my medications take my medicines as prescribed; bring my medications to every visit  Monitor my health keep track of my blood pressure  Eat healthy foods eat foods that are low in salt  Other (No Data)       Care Management & Community Referrals:  Referral 11/13/2012  Referrals made for care management support none needed

## 2012-11-13 NOTE — Assessment & Plan Note (Signed)
Left lower lobe pulmonary nodules seen on CT scan during his hospitalization.  -As recommended, will pursue followup CT scan in 3-6 months.  Ordered today.

## 2012-11-13 NOTE — ED Provider Notes (Signed)
Medical screening examination/treatment/procedure(s) were conducted as a shared visit with non-physician practitioner(s) and myself.  I personally evaluated the patient during the encounter  Pt stable and well appearing.  No tongue swelling noted.  Advised to stop his ACE inhibitor Stable for d/c home  Joya Gaskins, MD 11/13/12 [phone]

## 2012-11-13 NOTE — Progress Notes (Signed)
HPI The patient is a 52 y.o. male, presenting for a hospital follow-up.  The patient was recently hospitalized 9/17-9/26, with newly diagnosed ischemic CM (EF 15%) and NSTEMI.  Since hospital discharge, the patient has followed-up with CHF clinic.  He notes compliance with his daily medications, and brings a list of his medications and his daily weight log to today's visit. Of note, the patient was seen in the ED yesterday, for lip swelling thought to represent angioedema caused by lisinopril, and the patient is currently taking Benadryl and a prednisone taper.  The patient notes no symptoms of dyspnea, PND, or leg swelling. His daily weight log shows a consistent stable to downward trend, with his home weight today recorded at 235 lbs, down from 236 yesterday (of note, our scale showed 241.5 lbs).   The patient's blood pressure is well controlled today.  The patient notes a nosebleed, which started at 11:00 this morning. The patient notes no similar history of nosebleeds. The patient is currently taking aspirin and Brilinta, since his last hospitalization.  He describes the bleeding as a slow oozing from his nose. He has tried applying pressure to the area with no success.  The patient quit smoking during his last hospitalization, and has not had a cigarette since that time.  He notes no cravings.  He is not using any smoking cessation aid.  ROS: General: no fevers, chills, changes in weight, changes in appetite Skin: no rash HEENT: no blurry vision, hearing changes, sore throat Pulm: no dyspnea, coughing, wheezing CV: no chest pain, palpitations, shortness of breath Abd: no abdominal pain, nausea/vomiting, diarrhea/constipation GU: no dysuria, hematuria, polyuria Ext: no arthralgias, myalgias Neuro: no weakness, numbness, or tingling  Filed Vitals:   11/13/12 1352  BP: 105/72  Pulse: 72  Temp: 97.6 F (36.4 C)    PEX General: alert, cooperative, and in no apparent distress HEENT:  PERRL, EOMI, left anterior nostril with slight oozing of blood from Keisselbach's plexus Neck: supple, no lymphadenopathy, no JVD Lungs: clear to ascultation bilaterally, normal work of respiration, no wheezes, rales, ronchi Heart: regular rate and rhythm, no murmurs, gallops, or rubs Abdomen: soft, non-tender, non-distended, normal bowel sounds Extremities: no cyanosis, clubbing, or edema Neurologic: alert & oriented X3, cranial nerves II-XII intact, strength grossly intact, sensation intact to light touch  Current Outpatient Prescriptions on File Prior to Visit  Medication Sig Dispense Refill  . aspirin EC 81 MG tablet Take 81 mg by mouth daily.      Marland Kitchen atorvastatin (LIPITOR) 20 MG tablet Take 1 tablet (20 mg total) by mouth daily at 6 PM.  30 tablet  6  . carvedilol (COREG) 6.25 MG tablet Take 1.5 tablets (9.375 mg total) by mouth 2 (two) times daily with a meal.  90 tablet  6  . digoxin (LANOXIN) 0.125 MG tablet Take 1 tablet (0.125 mg total) by mouth daily.  30 tablet  6  . diphenhydrAMINE (BENADRYL) 25 MG tablet Take 25 mg by mouth every 6 (six) hours as needed for itching (swelling).      . furosemide (LASIX) 40 MG tablet Take 1 tablet (40 mg total) by mouth daily.  30 tablet  6  . predniSONE (DELTASONE) 20 MG tablet Take 2 tablets (40 mg total) by mouth daily.  8 tablet  0  . spironolactone (ALDACTONE) 12.5 mg TABS tablet Take 0.5 tablets (12.5 mg total) by mouth daily.  15 tablet  6  . Ticagrelor (BRILINTA) 90 MG TABS tablet Take 1 tablet (90  mg total) by mouth 2 (two) times daily.  60 tablet  6   No current facility-administered medications on file prior to visit.    Assessment/Plan

## 2012-11-16 NOTE — Progress Notes (Signed)
Case discussed with Dr. Brown at the time of the visit.  We reviewed the resident's history and exam and pertinent patient test results.  I agree with the assessment, diagnosis, and plan of care documented in the resident's note. 

## 2012-11-24 ENCOUNTER — Encounter (HOSPITAL_COMMUNITY): Payer: BC Managed Care – PPO

## 2012-11-24 ENCOUNTER — Ambulatory Visit (HOSPITAL_COMMUNITY)
Admission: RE | Admit: 2012-11-24 | Discharge: 2012-11-24 | Disposition: A | Discharge status: Home / Self Care | Payer: BC Managed Care – PPO | Source: Ambulatory Visit | Attending: Internal Medicine | Admitting: Internal Medicine

## 2012-11-24 VITALS — BP 124/82 | HR 72 | Wt 241.0 lb

## 2012-11-24 DIAGNOSIS — X58XXXA Exposure to other specified factors, initial encounter: Secondary | ICD-10-CM | POA: Insufficient documentation

## 2012-11-24 DIAGNOSIS — Z79899 Other long term (current) drug therapy: Secondary | ICD-10-CM | POA: Insufficient documentation

## 2012-11-24 DIAGNOSIS — I5022 Chronic systolic (congestive) heart failure: Secondary | ICD-10-CM | POA: Insufficient documentation

## 2012-11-24 DIAGNOSIS — Z7982 Long term (current) use of aspirin: Secondary | ICD-10-CM | POA: Insufficient documentation

## 2012-11-24 DIAGNOSIS — T783XXA Angioneurotic edema, initial encounter: Secondary | ICD-10-CM | POA: Insufficient documentation

## 2012-11-24 DIAGNOSIS — I509 Heart failure, unspecified: Secondary | ICD-10-CM | POA: Insufficient documentation

## 2012-11-24 DIAGNOSIS — E119 Type 2 diabetes mellitus without complications: Secondary | ICD-10-CM | POA: Insufficient documentation

## 2012-11-24 DIAGNOSIS — Z87891 Personal history of nicotine dependence: Secondary | ICD-10-CM | POA: Insufficient documentation

## 2012-11-24 DIAGNOSIS — I2510 Atherosclerotic heart disease of native coronary artery without angina pectoris: Secondary | ICD-10-CM | POA: Insufficient documentation

## 2012-11-24 DIAGNOSIS — I252 Old myocardial infarction: Secondary | ICD-10-CM | POA: Insufficient documentation

## 2012-11-24 DIAGNOSIS — I10 Essential (primary) hypertension: Secondary | ICD-10-CM | POA: Insufficient documentation

## 2012-11-24 LAB — BASIC METABOLIC PANEL
BUN: 16 mg/dL (ref 6–23)
Calcium: 9.5 mg/dL (ref 8.4–10.5)
Creatinine, Ser: 0.82 mg/dL (ref 0.50–1.35)
GFR calc non Af Amer: >90 mL/min (ref >90)
Glucose, Bld: 175 mg/dL — ABNORMAL HIGH (ref 70–99)

## 2012-11-24 LAB — DIGOXIN LEVEL: Digoxin Level: 0.7 ng/mL — ABNORMAL LOW (ref 0.8–2.0)

## 2012-11-24 MED ORDER — HYDRALAZINE HCL 25 MG PO TABS: 12.5000 mg | ORAL_TABLET | Freq: Three times a day (TID) | ORAL | Status: DC

## 2012-11-24 MED ORDER — ISOSORBIDE MONONITRATE ER 30 MG PO TB24: 30.0000 mg | ORAL_TABLET | Freq: Every day | ORAL | Status: DC

## 2012-11-24 NOTE — Progress Notes (Signed)
Patient ID: Devon [name], male   DOB: 1960/07/27, 52 y.o.   MRN: [id number]   Weight Range  236-239 pounds  Baseline proBNP   PCP: Dr Manson Passey   HPI: Devon [name] is a 52 year old man with history of DM2 (diagnosed at urgent care ), HTN (diagnosed at urgent care last week), ETOH, CAD s/p anterior MI 2004 [place] in 2004 with cath and stent x 2 placed at that time.    Admitted to [facility] 9/17/ through 11/03/12 with acute/chronic systolic heart failure. Had cath as noted below with stent. 10/30/12 ECHO 15%. Started on carvedilol 6.25 mg twice daily, spiro 12.5 mg daily, lasix 40 mg daily, and lisinopril 5 mg bid.  D/C weight 236 pounds.   11/03/12 R & LHC S/p PCI/stenting of LCX with embolization to OM-1  RA 12  RV 51/7  PA 50/25  PCWP 23  LVEDP 21  Fick 4.0  Thermo 4.4  Labs 11/05/12 Creatinine 1.0 K 4.0  Labs 11/11/12 Creatinine 1.0 K 4.8 Pro BNP 1451   He returns for hospital follow up. Since the last visit he developed angioedema and lisinopril was stopped. Evaluated in [facility] ED and given benadryl and prednisone. Weight at home 236-239 pounds. He has not required additional lasix. Mild dyspnea going up steps. Denies PND/Orthopnea/CP. Walking up to a mile at a time. Compliant with medications.  Following low salt diet. Limiting fluid intake to < 2 liters.    FH: Father ,brother,sister HTN  SH: Former smoker quit 10/2012. Does not drink alcohol  ROS: All systems negative except as listed in HPI, PMH and Problem List.  Past Medical History  Diagnosis Date  . Coronary artery disease   . Hypertension   . CHF (congestive heart failure)   . Diabetes mellitus without complication     Current Outpatient Prescriptions  Medication Sig Dispense Refill  . aspirin EC 81 MG tablet Take 81 mg by mouth daily.      Marland Kitchen atorvastatin (LIPITOR) 20 MG tablet Take 1 tablet (20 mg total) by mouth daily at 6 PM.  30 tablet  6  . carvedilol (COREG) 6.25 MG tablet Take 1.5 tablets (9.375 mg total) by mouth 2 (two)  times daily with a meal.  90 tablet  6  . digoxin (LANOXIN) 0.125 MG tablet Take 1 tablet (0.125 mg total) by mouth daily.  30 tablet  6  . furosemide (LASIX) 40 MG tablet Take 1 tablet (40 mg total) by mouth daily.  30 tablet  6  . spironolactone (ALDACTONE) 12.5 mg TABS tablet Take 0.5 tablets (12.5 mg total) by mouth daily.  15 tablet  6  . Ticagrelor (BRILINTA) 90 MG TABS tablet Take 1 tablet (90 mg total) by mouth 2 (two) times daily.  60 tablet  6  . diphenhydrAMINE (BENADRYL) 25 MG tablet Take 25 mg by mouth every 6 (six) hours as needed for itching (swelling).      Marland Kitchen oxymetazoline (AFRIN) 0.05 % nasal spray Place 2 sprays in left nostril once as needed, for nosebleed.  30 mL  0   No current facility-administered medications for this encounter.     PHYSICAL EXAM: Filed Vitals:   11/24/12 0842  BP: 124/82  Pulse: 72  Weight: 241 lb (109.317 kg)  SpO2: 98%    General:  Well appearing. No resp difficulty HEENT: normal Neck: supple. JVP flat. Carotids 2+ bilaterally; no bruits. No lymphadenopathy or thryomegaly appreciated. Cor: PMI normal. Regular rate & rhythm. No rubs, gallops or  murmurs. Lungs: clear Abdomen: soft, nontender, nondistended. No hepatosplenomegaly. No bruits or masses. Good bowel sounds. Extremities: no cyanosis, clubbing, rash, edema Neuro: alert & orientedx3, cranial nerves grossly intact. Moves all 4 extremities w/o difficulty. Affect pleasant.   ASSESSMENT & PLAN: 1. Chronic Systolic Heart Failure ECHO 10/30/12 EF 15%  NYHA II. Mild Dyspnea with steps.  Volume status stable . Dry weight 236 pounds. Instructed to take an extra lasix if weight is 240 pounds or greater. Volume status stable. Continue lasix 40 mg daily and 12.5 mg spironolactone daily. -Continue carvedilol to 9.375 mg twice a day. Continue digoxin 0.125 mg daily -Add hydralazine 12.5 mg tid and Imdur 15 mg daily.  -He is not on  Lisinopril due to angioedema.  -Reinforced daily weights,  medication compliance, and low salt food choices. Provided weight chart to record daily weights.  Will need repeat ECHO aft HF meds optimized . Mid December Check dig level, bmet , and pro-bnp  2. CAD H/O MI in [place]. S/P PCI/stenting of LCX with embolization to OM-1  No evidence of ischemia. Continue brillinta and lipitor . Refer to cardiac rehab.   3. Former Smoker. Quit 10/2012 Congratulated on smoking cessation  4. HTN  Stable continue current regimen  5. Snores Resolved    Follow up in 2 weeks. Anticipate increasing beta blocker at next viist.   CLEGG,AMYNP-C 8:47 AM

## 2012-11-24 NOTE — Patient Instructions (Addendum)
Follow up in 2 weeks.   Take hydralazine 12.5 mg tid  Take Imdur 30 mg daily  Do the following things EVERYDAY: 1) Weigh yourself in the morning before breakfast. Write it down and keep it in a log. 2) Take your medicines as prescribed 3) Eat low salt foods-Limit salt (sodium) to 2000 mg per day.  4) Stay as active as you can everyday 5) Limit all fluids for the day to less than 2 liters

## 2012-11-25 NOTE — Addendum Note (Signed)
Encounter addended by: Carleene Cooper on: 11/25/2012  2:54 PM<BR>     Documentation filed: Episodes

## 2012-12-08 NOTE — Progress Notes (Signed)
Patient ID: Devon [name], male   DOB: May 04, 1960, 52 y.o.   MRN: [id number]   Weight Range  236-239 pounds  Baseline proBNP   PCP: Dr Manson Passey   HPI: Devon [name] is a 52 year old man with history of DM2 (diagnosed at urgent care ), HTN (diagnosed at urgent care last week), ETOH, CAD s/p anterior MI 2004 [place] in 2004 with cath and stent x 2 placed at that time.    Admitted to [facility] 9/17/ through 11/03/12 with acute/chronic systolic heart failure. Had cath as noted below with stent. 10/30/12 ECHO 15%. Started on carvedilol 6.25 mg twice daily, spiro 12.5 mg daily, lasix 40 mg daily, and lisinopril 5 mg bid.  D/C weight 236 pounds.   11/03/12 R & LHC S/p PCI/stenting of LCX with embolization to OM-1  RA 12  RV 51/7  PA 50/25  PCWP 23  LVEDP 21  Fick 4.0  Thermo 4.4  Labs 11/05/12 Creatinine 1.0 K 4.0  Labs 11/11/12 Creatinine 1.0 K 4.8 Pro BNP 1451  Labs 11/24/12 Creatinine 0.82 K 4.0 Pro BNP 1066, digoxin 0.7  He returns for  follow up. Last visit hydralazine 12.5 mg tid and Imdur 15 mg added. Denies SOB/PND/Orthopnea/CP . Does admit to mild dyspnea going up steps. Able to walk 1 mile. Weight at home 237-239 pounds. Compliant with medications.  Following low salt diet. Limiting fluid intake to < 2 liters.  He is not on Ace due to angioedema.   FH: Father CVA brother,sister HTN  SH: Former smoker quit 10/2012. Does not drink alcohol  ROS: All systems negative except as listed in HPI, PMH and Problem List.  Past Medical History  Diagnosis Date  . Coronary artery disease   . Hypertension   . CHF (congestive heart failure)   . Diabetes mellitus without complication     Current Outpatient Prescriptions  Medication Sig Dispense Refill  . aspirin EC 81 MG tablet Take 81 mg by mouth daily.      Marland Kitchen atorvastatin (LIPITOR) 20 MG tablet Take 1 tablet (20 mg total) by mouth daily at 6 PM.  30 tablet  6  . carvedilol (COREG) 6.25 MG tablet Take 1.5 tablets (9.375 mg total) by mouth 2 (two) times  daily with a meal.  90 tablet  6  . digoxin (LANOXIN) 0.125 MG tablet Take 1 tablet (0.125 mg total) by mouth daily.  30 tablet  6  . furosemide (LASIX) 40 MG tablet Take 1 tablet (40 mg total) by mouth daily.  30 tablet  6  . hydrALAZINE (APRESOLINE) 25 MG tablet Take 0.5 tablets (12.5 mg total) by mouth 3 (three) times daily.  45 tablet  3  . isosorbide mononitrate (IMDUR) 30 MG 24 hr tablet Take 1 tablet (30 mg total) by mouth daily.  30 tablet  3  . oxymetazoline (AFRIN) 0.05 % nasal spray Place 2 sprays in left nostril once as needed, for nosebleed.  30 mL  0  . spironolactone (ALDACTONE) 12.5 mg TABS tablet Take 0.5 tablets (12.5 mg total) by mouth daily.  15 tablet  6  . Ticagrelor (BRILINTA) 90 MG TABS tablet Take 1 tablet (90 mg total) by mouth 2 (two) times daily.  60 tablet  6   No current facility-administered medications for this encounter.     PHYSICAL EXAM: Filed Vitals:   12/09/12 0958  BP: 132/68  Pulse: 67  Weight: 248 lb (112.492 kg)  SpO2: 99%    General:  Well appearing. No  resp difficulty HEENT: normal Neck: supple. JVP flat. Carotids 2+ bilaterally; no bruits. No lymphadenopathy or thryomegaly appreciated. Cor: PMI normal. Regular rate & rhythm. No rubs, gallops or murmurs. Lungs: clear Abdomen: soft, nontender, nondistended. No hepatosplenomegaly. No bruits or masses. Good bowel sounds. Extremities: no cyanosis, clubbing, rash, edema Neuro: alert & orientedx3, cranial nerves grossly intact. Moves all 4 extremities w/o difficulty. Affect pleasant.  ASSESSMENT & PLAN: 1. Chronic Systolic Heart Failure:  ECHO 1/61/09 EF 15%, ischemic cardiomyopathy. NYHA II. Volume status stable . Dry weight 236 pounds.  - Instructed to take an extra lasix if weight is 240 pounds or greater. Volume status stable. Continue lasix 40 mg daily.  - Increase spironolactone to 25 mg daily. - Continue carvedilol to 9.375 mg twice a day. Continue digoxin 0.125 mg daily (level ok this  month).  - Increase hydralazine to 25 mg tid and continue Imdur 30 mg daily.  - He is not on  Lisinopril due to angioedema.  -Reinforced daily weights, medication compliance, and low salt food choices. Provided weight chart to record daily weights.  - Will need repeat ECHO after HF meds optimized > Mid December - Check BMET in 10 days.    2. CAD: H/O MI in [place]. S/P PCI/stenting of LCX with embolization to OM-1 in 9/14.  No ischemic symptoms. Continue brillinta for 1 year. Continue lipitor. Referred  to cardiac rehab. Check cholesterol in 10 days.   3. Former Smoker: Quit 10/2012.  Congratulated on smoking cessation   4. HTN: Stable continue current regimen  Follow up in 2 weeks.   CLEGG,AMY NP-C 10:00 AM  Patient seen with NP, agree with the above note. He looks euvolemic on exam, NYHA class II symptoms.  Ischemic CMP with last EF 15%.  - Increase spironolactone and hydralazine/Imdur as above.  - Repeat echo in 12/14 (? ICD needed).   Marca Ancona 12/09/2012

## 2012-12-09 ENCOUNTER — Encounter (HOSPITAL_COMMUNITY): Payer: Self-pay | Admitting: *Deleted

## 2012-12-09 ENCOUNTER — Ambulatory Visit (HOSPITAL_COMMUNITY)
Admission: RE | Admit: 2012-12-09 | Discharge: 2012-12-09 | Disposition: A | Discharge status: Home / Self Care | Payer: BC Managed Care – PPO | Source: Ambulatory Visit | Attending: Internal Medicine | Admitting: Internal Medicine

## 2012-12-09 VITALS — BP 132/68 | HR 67 | Wt 248.0 lb

## 2012-12-09 DIAGNOSIS — I10 Essential (primary) hypertension: Secondary | ICD-10-CM | POA: Insufficient documentation

## 2012-12-09 DIAGNOSIS — I2510 Atherosclerotic heart disease of native coronary artery without angina pectoris: Secondary | ICD-10-CM | POA: Insufficient documentation

## 2012-12-09 DIAGNOSIS — I5022 Chronic systolic (congestive) heart failure: Secondary | ICD-10-CM | POA: Insufficient documentation

## 2012-12-09 MED ORDER — HYDRALAZINE HCL 25 MG PO TABS: 25.0000 mg | ORAL_TABLET | Freq: Three times a day (TID) | ORAL | Status: DC

## 2012-12-09 MED ORDER — SPIRONOLACTONE 12.5 MG HALF TABLET: 25.0000 mg | ORAL_TABLET | Freq: Every day | ORAL | Status: DC

## 2012-12-09 MED ORDER — SPIRONOLACTONE 25 MG PO TABS: 25.0000 mg | ORAL_TABLET | Freq: Every day | ORAL | Status: DC

## 2012-12-09 NOTE — Patient Instructions (Signed)
Take Spironolactone to 25 mg daily  Take hydralazine 25 mg three times a day  Do the following things EVERYDAY: 1) Weigh yourself in the morning before breakfast. Write it down and keep it in a log. 2) Take your medicines as prescribed 3) Eat low salt foods-Limit salt (sodium) to 2000 mg per day.  4) Stay as active as you can everyday 5) Limit all fluids for the day to less than 2 liters  Follow up in 3 months

## 2012-12-16 ENCOUNTER — Ambulatory Visit (INDEPENDENT_AMBULATORY_CARE_PROVIDER_SITE_OTHER): Payer: BC Managed Care – PPO | Admitting: Internal Medicine

## 2012-12-16 ENCOUNTER — Encounter: Payer: Self-pay | Admitting: Internal Medicine

## 2012-12-16 VITALS — BP 109/68 | HR 65 | Temp 97.8°F | Ht 70.0 in | Wt 255.3 lb

## 2012-12-16 DIAGNOSIS — I5022 Chronic systolic (congestive) heart failure: Secondary | ICD-10-CM

## 2012-12-16 DIAGNOSIS — R911 Solitary pulmonary nodule: Secondary | ICD-10-CM

## 2012-12-16 DIAGNOSIS — Z23 Encounter for immunization: Secondary | ICD-10-CM

## 2012-12-16 DIAGNOSIS — I10 Essential (primary) hypertension: Secondary | ICD-10-CM

## 2012-12-16 DIAGNOSIS — R918 Other nonspecific abnormal finding of lung field: Secondary | ICD-10-CM

## 2012-12-16 NOTE — Assessment & Plan Note (Signed)
Followup chest CT was ordered during his last visit early October 2014. I changed this order today so that patient will receive this procedure in January rather than as late as February given the nodule is 8 mm and patient has smoked in the past. Patient also reports having a mild chronic cough, which is somewhat concerning. Therefore I think it is prudent to evaluate this nodule earlier rather than later.

## 2012-12-16 NOTE — Patient Instructions (Signed)
Thank you for your visit. Please follow up in 3 months.  Today I scheduled you for a follow up chest CT scan in January 2015.  Someone should call you to schedule this appointment.   You received the flu shot today.

## 2012-12-16 NOTE — Progress Notes (Signed)
Patient ID: Devon [name], male   DOB: May 23, 1960, 52 y.o.   MRN: [id number]. HPI The patient is a 52 y.o. male with a history of hypertension, pulmonary hypertension, previous MI, systolic CHF EF of 15% 10/30/2012, CAD with drug-eluting stent placed in September 2014, subclinical hypothyroidism presents for a routine clinic visit today.  Patient feels well overall though he reports that he has some mild shortness of breath after climbing 78 stairs. He has no shortness of breath with walking normally and is able to do all ADLs and IADLs. He reports that he knows his weight is up today because he's been snacking more on ice cream and various sweets. He knows this is bad for his weight and intends to stop. His fiance has been encouraging him to walk more than patient reports he is working up to walking everyday. He will be starting cardiac rehabilitation tomorrow. He has followed up with cardiology since his admission to the hospital in September and is now managed on aspirin, Lipitor, Coreg, digoxin, Lasix, hydralazine, Imdur, spironolactone, brilinta. Today patient denies having chest pain, fevers chills, sore throat, headache, nausea vomiting diarrhea, dysuria, difficulty urinating, hematochezia. He does have a chronic mild cough that he's had for years, no changes recently. He also has a history of incidental pulmonary nodule found on chest CT back in September 2014. Radiologist at this time recommended followup chest CT in 3-6 months.  Patient complains of having some mild soreness to his stomach muscles bilaterally that's been going on for 1-2 weeks. No previous injury or trauma. Patient is unsure what brought this on. He has no other GI complaints today.  ROS: General: no fevers, chills, changes in weight, changes in appetite Skin: no rash HEENT: no blurry vision, hearing changes, sore throat Pulm: See history of present illness CV: See history of present illness Abd: See history of present  illness GU: no dysuria, hematuria, polyuria Ext: no arthralgias, myalgias Neuro: no weakness, numbness, or tingling  Filed Vitals:   12/16/12 1013  BP: 109/68  Pulse: 65  Temp: 97.8 F (36.6 C)  Weight 255 (up from 248 10/29)  Physical Exam: General: alert, cooperative, and in no apparent distress HEENT: pupils equal round and reactive to light, vision grossly intact, oropharynx clear and non-erythematous  Neck: supple, no lymphadenopathy, JVD, or carotid bruits Lungs: clear to ascultation bilaterally, normal work of respiration, no wheezes, rales, ronchi Heart: regular rate and rhythm, 3/6 systolic murmur, gallops, or rubs Abdomen: soft, protuberant, mild TTP to RUQ/LUQ, normal bowel sounds Extremities: warm extremities bilaterally, no BLE edema Neurologic: alert & oriented X3, cranial nerves II-XII intact, strength grossly intact, sensation intact to light touch  Current Outpatient Prescriptions on File Prior to Visit  Medication Sig Dispense Refill  . aspirin EC 81 MG tablet Take 81 mg by mouth daily.      Marland Kitchen atorvastatin (LIPITOR) 20 MG tablet Take 1 tablet (20 mg total) by mouth daily at 6 PM.  30 tablet  6  . carvedilol (COREG) 6.25 MG tablet Take 1.5 tablets (9.375 mg total) by mouth 2 (two) times daily with a meal.  90 tablet  6  . digoxin (LANOXIN) 0.125 MG tablet Take 1 tablet (0.125 mg total) by mouth daily.  30 tablet  6  . furosemide (LASIX) 40 MG tablet Take 1 tablet (40 mg total) by mouth daily.  30 tablet  6  . hydrALAZINE (APRESOLINE) 25 MG tablet Take 1 tablet (25 mg total) by mouth 3 (three) times daily.  90 tablet  3  . isosorbide mononitrate (IMDUR) 30 MG 24 hr tablet Take 1 tablet (30 mg total) by mouth daily.  30 tablet  3  . oxymetazoline (AFRIN) 0.05 % nasal spray Place 2 sprays in left nostril once as needed, for nosebleed.  30 mL  0  . spironolactone (ALDACTONE) 25 MG tablet Take 1 tablet (25 mg total) by mouth daily.  30 tablet  6  . Ticagrelor (BRILINTA)  90 MG TABS tablet Take 1 tablet (90 mg total) by mouth 2 (two) times daily.  60 tablet  6   No current facility-administered medications on file prior to visit.    Assessment/Plan

## 2012-12-16 NOTE — Assessment & Plan Note (Signed)
Patient has mild shortness of breath when he climbs stairs but otherwise is not short of breath with walking. I do not believe patient is fluid overloaded today as his lungs are clear, he has no bilateral lower extremity edema. He reports compliance with his medication regimen and is followed by heart failure clinic, last seen by heart failure in October of 2014. Patient has a followup appointment with the heart failure team in November 2014. Patient is aware that his weight is up today at 255 pounds, his weight has been as low as 238 pounds back in September during his hospitalization. Patient reports decreased salt intake however he is snacking on a lot of sweets including cake, pie, ice cream. He and his fiance are aware that this snacking may be the cause of his gaining weight. He intends to stop snacking quite as much and I suggested eating the sweets in much smaller amounts or attempting to cut them out completely. He also intends to start walking more as well as starting cardiac rehabilitation tomorrow. His plan is to lose weight by the next time he sees me in 3 months. -Flu shot -No changes to medication regimen as he is followed by heart failure

## 2012-12-16 NOTE — Assessment & Plan Note (Signed)
BP Readings from Last 3 Encounters:  12/16/12 109/68  12/09/12 132/68  11/24/12 124/82    Lab Results  Component Value Date   NA 136 11/24/2012   K 4.0 11/24/2012   CREATININE 0.82 11/24/2012    Assessment: Blood pressure control:  good Progress toward BP goal:   at goal Comments:   Plan: Medications:  continue current medications Educational resources provided:   Self management tools provided:   Other plans: no changes to antihypertensive regimen at this time

## 2012-12-17 ENCOUNTER — Encounter (HOSPITAL_COMMUNITY)
Admission: RE | Admit: 2012-12-17 | Discharge: 2012-12-17 | Disposition: A | Discharge status: Home / Self Care | Payer: BC Managed Care – PPO | Source: Ambulatory Visit | Attending: Internal Medicine | Admitting: Internal Medicine

## 2012-12-17 DIAGNOSIS — Z9861 Coronary angioplasty status: Secondary | ICD-10-CM | POA: Insufficient documentation

## 2012-12-17 DIAGNOSIS — I252 Old myocardial infarction: Secondary | ICD-10-CM | POA: Insufficient documentation

## 2012-12-17 DIAGNOSIS — I2510 Atherosclerotic heart disease of native coronary artery without angina pectoris: Secondary | ICD-10-CM | POA: Insufficient documentation

## 2012-12-17 DIAGNOSIS — Z5189 Encounter for other specified aftercare: Secondary | ICD-10-CM | POA: Insufficient documentation

## 2012-12-17 DIAGNOSIS — I5022 Chronic systolic (congestive) heart failure: Secondary | ICD-10-CM | POA: Insufficient documentation

## 2012-12-17 DIAGNOSIS — I509 Heart failure, unspecified: Secondary | ICD-10-CM | POA: Insufficient documentation

## 2012-12-17 NOTE — Progress Notes (Signed)
I saw and evaluated the patient.  I personally confirmed the key portions of the history and exam documented by Dr. Chikowsky and I reviewed pertinent patient test results.  The assessment, diagnosis, and plan were formulated together and I agree with the documentation in the resident's note. 

## 2012-12-17 NOTE — Progress Notes (Signed)
Cardiac Rehab Medication Review by a Pharmacist  Does the patient  feel that his/her medications are working for him/her?  yes  Has the patient been experiencing any side effects to the medications prescribed?  no  Does the patient measure his/her own blood pressure or blood glucose at home?  no   Does the patient have any problems obtaining medications due to transportation or finances?   no  Understanding of regimen: good Understanding of indications: fair Potential of compliance: excellent    Pharmacist comments:  36 YOM who presents today in good spirits with his wife, who has an accurate and updated list of all current medications. From my assessment, patient has a fair understanding of the indications for his medications, though his wife (who mainly manages them) is very vigilant and proactive. I have updated all allergies and medications on his list. Patient denies any side effects and appears to have a great potential for compliance. He does not take his BP at home. Today his BP was 102/70.  Thank you for allowing me to take part in the care of this patient,  Britt Bottom B. Artelia Laroche, PharmD Clinical Pharmacist - Resident Pager: [phone] Phone: [phone] 12/17/2012 8:13 AM

## 2012-12-21 ENCOUNTER — Encounter (HOSPITAL_COMMUNITY)
Admission: RE | Admit: 2012-12-21 | Discharge: 2012-12-21 | Disposition: A | Discharge status: Home / Self Care | Payer: BC Managed Care – PPO | Source: Ambulatory Visit | Attending: Internal Medicine | Admitting: Internal Medicine

## 2012-12-21 NOTE — Progress Notes (Signed)
Pt in today for his first day of exercise at the 6:45 cardiac rehab phase II program.  Pt tolerated exercise with no complaints or distress.  Monitor showed Sr with ST depression and [place] inversion.  Pt accompanied by his fiancee. PHQ2 score 0.  Pt demonstrates an appropriate positive outlook on life.  Continue to monitor.

## 2012-12-23 ENCOUNTER — Encounter (HOSPITAL_COMMUNITY)
Admission: RE | Admit: 2012-12-23 | Discharge: 2012-12-23 | Disposition: A | Discharge status: Home / Self Care | Payer: BC Managed Care – PPO | Source: Ambulatory Visit | Attending: Internal Medicine | Admitting: Internal Medicine

## 2012-12-25 ENCOUNTER — Encounter (HOSPITAL_COMMUNITY)
Admission: RE | Admit: 2012-12-25 | Discharge: 2012-12-25 | Disposition: A | Discharge status: Home / Self Care | Payer: BC Managed Care – PPO | Source: Ambulatory Visit | Attending: Internal Medicine | Admitting: Internal Medicine

## 2012-12-28 ENCOUNTER — Encounter (HOSPITAL_COMMUNITY)
Admission: RE | Admit: 2012-12-28 | Discharge: 2012-12-28 | Disposition: A | Discharge status: Home / Self Care | Payer: BC Managed Care – PPO | Source: Ambulatory Visit | Attending: Internal Medicine | Admitting: Internal Medicine

## 2012-12-28 NOTE — Progress Notes (Signed)
Patient ID: Devon [name], male   DOB: [birth year], 52 y.o.   MRN: [id number]  Weight Range  236-239 pounds  Baseline proBNP   PCP: Dr Manson Passey   HPI: Devon [name] is a 52 year old man with history of DM2, HTN, ETOH, CAD s/p anterior MI 2004 in [place] with cath and stent x 2 placed at that time. 10/2012 DES mid LCX and embolic obstruction OM-2 partially recanalized with angioplasty.   Admitted to [facility] 10/28/12 through 11/03/12 with acute/chronic systolic heart failure. Had cath as noted below with stent. 10/30/12 ECHO 15%. Started on carvedilol 6.25 mg twice daily, spiro 12.5 mg daily, lasix 40 mg daily, and lisinopril 5 mg bid.  D/C weight 236 pounds.   11/03/12 R & LHC RA 12  RV 51/7  PA 50/25  PCWP 23  LVEDP 21  Fick 4.0  Thermo 4.4  Labs 11/05/12 Creatinine 1.0 K 4.0  Labs 11/11/12 Creatinine 1.0 K 4.8 Pro BNP 1451  Labs 11/24/12 Creatinine 0.82 K 4.0 Pro BNP 1066, digoxin 0.7  Follow up: Last visit increased Spiro to 25 mg daily and hydralazine to 25 mg TID. He is now participating in CR. Feeling good. Denies SOB, orthopnea, CP, or edema. DOE with going up stairs and inclined surfaces. Compliant with medications. Weight at home 248-250. Following low salt diet and restricting fluids to less than 2L. Eating lot of snacks. He is not on Ace due to angioedema.   FH: Father CVA brother,sister HTN  SH: Former smoker quit 10/2012. Does not drink alcohol  ROS: All systems negative except as listed in HPI, PMH and Problem List.  Past Medical History  Diagnosis Date  . Coronary artery disease   . Hypertension   . CHF (congestive heart failure)   . Diabetes mellitus without complication     Current Outpatient Prescriptions  Medication Sig Dispense Refill  . aspirin EC 81 MG tablet Take 81 mg by mouth daily.      Marland Kitchen atorvastatin (LIPITOR) 20 MG tablet Take 1 tablet (20 mg total) by mouth daily at 6 PM.  30 tablet  6  . carvedilol (COREG) 6.25 MG tablet Take 1.5 tablets (9.375 mg total) by mouth  2 (two) times daily with a meal.  90 tablet  6  . digoxin (LANOXIN) 0.125 MG tablet Take 1 tablet (0.125 mg total) by mouth daily.  30 tablet  6  . furosemide (LASIX) 40 MG tablet Take 1 tablet (40 mg total) by mouth daily.  30 tablet  6  . hydrALAZINE (APRESOLINE) 25 MG tablet Take 1 tablet (25 mg total) by mouth 3 (three) times daily.  90 tablet  3  . isosorbide mononitrate (IMDUR) 30 MG 24 hr tablet Take 1 tablet (30 mg total) by mouth daily.  30 tablet  3  . oxymetazoline (AFRIN) 0.05 % nasal spray Place 2 sprays in left nostril once as needed, for nosebleed.  30 mL  0  . spironolactone (ALDACTONE) 25 MG tablet Take 1 tablet (25 mg total) by mouth daily.  30 tablet  6  . Ticagrelor (BRILINTA) 90 MG TABS tablet Take 1 tablet (90 mg total) by mouth 2 (two) times daily.  60 tablet  6   No current facility-administered medications for this encounter.    Filed Vitals:   12/29/12 0843  BP: 116/70  Pulse: 75  Weight: 253 lb 6.4 oz (114.941 kg)  SpO2: 97%    PHYSICAL EXAM: General:  Well appearing. No resp difficulty HEENT: normal Neck:  supple. JVP flat. Carotids 2+ bilaterally; no bruits. No lymphadenopathy or thryomegaly appreciated. Cor: PMI normal. Regular rate & rhythm. No rubs, gallops or murmurs. Lungs: clear Abdomen: soft, nontender, nondistended. No hepatosplenomegaly. No bruits or masses. Good bowel sounds. Extremities: no cyanosis, clubbing, rash, edema Neuro: alert & orientedx3, cranial nerves grossly intact. Moves all 4 extremities w/o difficulty. Affect pleasant.  ASSESSMENT & PLAN:  1. Chronic Systolic Heart Failure:  ECHO 4/54/09 EF 15%, ischemic cardiomyopathy. NYHA II and volume status stable. Weight is up about 15 lbs since he was discharged in 10/2012, but it does not look like fluid. Will continue lasix 40 mg daily. - Will increase coreg to 12.5 mg BID. Instructed the patient to call if he notices any dizziness or increased fatigue. - Continue digoxin 0.125 mg,  hydralazine 25 mg TID and IMDUR 30 mg daily. - He is not on ACE-I d/t angioedema. - Reinforced the need and importance of daily weights, a low sodium diet, and fluid restriction (less than 2 L a day). Instructed to call the HF clinic if weight increases more than 3 lbs overnight or 5 lbs in a week.  - Check BMET today with increase in [place] last visit. - Reinforced the need and importance of daily weights, a low sodium diet, and fluid restriction (less than 2 L a day). Instructed to call the HF clinic if weight increases more than 3 lbs overnight or 5 lbs in a week.   2. CAD: H/O MI in [place]. S/P PCI/stenting of LCX with embolization to OM-2 in 9/14.  No ischemic symptoms. Continue brillinta for 1 year. Continue statin, ASA and BB. Continue CR. Check lipid profile today.  3. Hx of tobacco abuse- Quit 10/2012 and continues to abstain   4. HTN: Controlled, as above will increase coreg to 12.5 mg BID for HF.  Follow up in 3 weeks with ECHO.  Ulla Potash B NP-C 8:52 AM

## 2012-12-29 ENCOUNTER — Encounter (HOSPITAL_COMMUNITY): Payer: Self-pay | Admitting: *Deleted

## 2012-12-29 ENCOUNTER — Ambulatory Visit (HOSPITAL_COMMUNITY)
Admission: RE | Admit: 2012-12-29 | Discharge: 2012-12-29 | Disposition: A | Discharge status: Home / Self Care | Payer: BC Managed Care – PPO | Source: Ambulatory Visit | Attending: Internal Medicine | Admitting: Internal Medicine

## 2012-12-29 ENCOUNTER — Encounter (HOSPITAL_COMMUNITY): Payer: Self-pay

## 2012-12-29 VITALS — BP 116/70 | HR 75 | Wt 253.4 lb

## 2012-12-29 DIAGNOSIS — Z720 Tobacco use: Secondary | ICD-10-CM

## 2012-12-29 DIAGNOSIS — Z79899 Other long term (current) drug therapy: Secondary | ICD-10-CM | POA: Insufficient documentation

## 2012-12-29 DIAGNOSIS — I10 Essential (primary) hypertension: Secondary | ICD-10-CM | POA: Insufficient documentation

## 2012-12-29 DIAGNOSIS — I5022 Chronic systolic (congestive) heart failure: Secondary | ICD-10-CM | POA: Insufficient documentation

## 2012-12-29 DIAGNOSIS — Z7982 Long term (current) use of aspirin: Secondary | ICD-10-CM | POA: Insufficient documentation

## 2012-12-29 DIAGNOSIS — I252 Old myocardial infarction: Secondary | ICD-10-CM | POA: Insufficient documentation

## 2012-12-29 DIAGNOSIS — E119 Type 2 diabetes mellitus without complications: Secondary | ICD-10-CM | POA: Insufficient documentation

## 2012-12-29 DIAGNOSIS — Z87891 Personal history of nicotine dependence: Secondary | ICD-10-CM | POA: Insufficient documentation

## 2012-12-29 DIAGNOSIS — F17200 Nicotine dependence, unspecified, uncomplicated: Secondary | ICD-10-CM

## 2012-12-29 DIAGNOSIS — I2510 Atherosclerotic heart disease of native coronary artery without angina pectoris: Secondary | ICD-10-CM

## 2012-12-29 DIAGNOSIS — I509 Heart failure, unspecified: Secondary | ICD-10-CM | POA: Insufficient documentation

## 2012-12-29 LAB — BASIC METABOLIC PANEL
CO2: 25 mEq/L (ref 19–32)
Calcium: 9.5 mg/dL (ref 8.4–10.5)
Chloride: 99 mEq/L (ref 96–112)
GFR calc non Af Amer: 81 mL/min — ABNORMAL LOW (ref >90)
Potassium: 4 mEq/L (ref 3.5–5.1)
Sodium: 135 mEq/L (ref 135–145)

## 2012-12-29 LAB — LIPID PANEL
HDL: 36 mg/dL — ABNORMAL LOW (ref >39)
LDL Cholesterol: 94 mg/dL (ref 0–99)
VLDL: 31 mg/dL (ref 0–40)

## 2012-12-29 MED ORDER — CARVEDILOL 12.5 MG PO TABS: 12.5000 mg | ORAL_TABLET | Freq: Two times a day (BID) | ORAL | Status: DC

## 2012-12-29 NOTE — Patient Instructions (Addendum)
Increase your coreg to 12.5 mg (2 tablets) in the morning and 12.5 mg (2 tablets) in the evening. When your prescription runs out your new prescription will be 12.5 mg tablets and then you will take 1 tablet in the am and 1 tablet in the pm.  Will call with lab results.  Follow up 3 weeks with ECHO.  Do the following things EVERYDAY: 1) Weigh yourself in the morning before breakfast. Write it down and keep it in a log. 2) Take your medicines as prescribed 3) Eat low salt foods-Limit salt (sodium) to 2000 mg per day.  4) Stay as active as you can everyday 5) Limit all fluids for the day to less than 2 liters

## 2012-12-30 ENCOUNTER — Encounter (HOSPITAL_COMMUNITY)
Admission: RE | Admit: 2012-12-30 | Discharge: 2012-12-30 | Disposition: A | Discharge status: Home / Self Care | Payer: BC Managed Care – PPO | Source: Ambulatory Visit | Attending: Internal Medicine | Admitting: Internal Medicine

## 2012-12-30 ENCOUNTER — Encounter: Payer: Self-pay | Admitting: Internal Medicine

## 2012-12-30 NOTE — Progress Notes (Signed)
Reviewed home exercise with pt today.  Pt plans to walk for 15 mins, 2 times/day until he can walk 30 minutes consecutively for exercise.  Pt will walk 2-4 days/week with an additional day of hand weights.  Reviewed THR, pulse, RPE, sign and symptoms, NTG use, and when to call 911 or MD.  Pt voiced understanding.  Alexia Freestone, MS, ACSM RCEP 7:55 AM 12/30/2012

## 2012-12-30 NOTE — Progress Notes (Signed)
Pt arrived at cardiac rehab reporting carvedilol increased to 12.5mg  BID.  Medication list reconciled.

## 2013-01-01 ENCOUNTER — Encounter (HOSPITAL_COMMUNITY)
Admission: RE | Admit: 2013-01-01 | Discharge: 2013-01-01 | Disposition: A | Discharge status: Home / Self Care | Payer: BC Managed Care – PPO | Source: Ambulatory Visit | Attending: Internal Medicine | Admitting: Internal Medicine

## 2013-01-04 ENCOUNTER — Encounter (HOSPITAL_COMMUNITY)
Admission: RE | Admit: 2013-01-04 | Discharge: 2013-01-04 | Disposition: A | Discharge status: Home / Self Care | Payer: BC Managed Care – PPO | Source: Ambulatory Visit | Attending: Internal Medicine | Admitting: Internal Medicine

## 2013-01-06 ENCOUNTER — Encounter (HOSPITAL_COMMUNITY)
Admission: RE | Admit: 2013-01-06 | Discharge: 2013-01-06 | Disposition: A | Discharge status: Home / Self Care | Payer: BC Managed Care – PPO | Source: Ambulatory Visit | Attending: Internal Medicine | Admitting: Internal Medicine

## 2013-01-08 ENCOUNTER — Encounter (HOSPITAL_COMMUNITY): Payer: BC Managed Care – PPO

## 2013-01-11 ENCOUNTER — Encounter (HOSPITAL_COMMUNITY)
Admission: RE | Admit: 2013-01-11 | Discharge: 2013-01-11 | Disposition: A | Discharge status: Home / Self Care | Payer: BC Managed Care – PPO | Source: Ambulatory Visit | Attending: Internal Medicine | Admitting: Internal Medicine

## 2013-01-11 DIAGNOSIS — Z9861 Coronary angioplasty status: Secondary | ICD-10-CM | POA: Insufficient documentation

## 2013-01-11 DIAGNOSIS — I5022 Chronic systolic (congestive) heart failure: Secondary | ICD-10-CM | POA: Insufficient documentation

## 2013-01-11 DIAGNOSIS — Z5189 Encounter for other specified aftercare: Secondary | ICD-10-CM | POA: Insufficient documentation

## 2013-01-11 DIAGNOSIS — I252 Old myocardial infarction: Secondary | ICD-10-CM | POA: Insufficient documentation

## 2013-01-11 DIAGNOSIS — I509 Heart failure, unspecified: Secondary | ICD-10-CM | POA: Insufficient documentation

## 2013-01-11 DIAGNOSIS — I2510 Atherosclerotic heart disease of native coronary artery without angina pectoris: Secondary | ICD-10-CM | POA: Insufficient documentation

## 2013-01-11 NOTE — Progress Notes (Signed)
Pt with weight gain of 2kg since last exercise session on November 26th.  Pt reports heavier than usual eating due to Thanksgiving holiday eating.  Pt denies any shortness of breath or swelling.  Lungs clear to ausculation.  Pt advised that he should alert the HF clinic because of the weight gain.  Pt verbalized understanding and is in agreement.

## 2013-01-12 ENCOUNTER — Other Ambulatory Visit (HOSPITAL_COMMUNITY): Payer: BC Managed Care – PPO

## 2013-01-13 ENCOUNTER — Encounter (HOSPITAL_COMMUNITY)
Admission: RE | Admit: 2013-01-13 | Discharge: 2013-01-13 | Disposition: A | Discharge status: Home / Self Care | Payer: BC Managed Care – PPO | Source: Ambulatory Visit | Attending: Internal Medicine | Admitting: Internal Medicine

## 2013-01-13 NOTE — Progress Notes (Signed)
Devon [name] 52 y.o. male Nutrition Note Spoke with pt.  Nutrition Plan and Nutrition Survey goals reviewed with pt. Pt is following Step 1 of the Therapeutic Lifestyle Changes diet. Pt wants to lose wt. Pt has been trying to lose wt by "watching portion sizes." Pt reports he tends to skip meals/go longer than 5 hours without eating. Per pt, "the problem is snacking at night when I'm watching TV." Eating more frequently to help prevent binge eating and wt loss tips reviewed.  Pt is pre-diabetic according to his last A1c. Pt denies dx of DM. Pre-diabetes discussed. Pt has CHF and is watching his sodium intake by choosing fresh/frozen vegetables and avoiding adding sodium  Pt expressed understanding of the information reviewed. Pt aware of nutrition education classes offered and plans on attending nutrition classes.  Nutrition Diagnosis   Food-and nutrition-related knowledge deficit related to lack of exposure to information as related to diagnosis of: ? CVD ? DM (A1c 6.2)   Obesity related to excessive energy intake as evidenced by a BMI of 33.4  Nutrition RX/ Estimated Daily Nutrition Needs for: wt loss  1850-2350 Kcal, 50-65 gm fat, 12-16 gm sat fat, 1.8-2.4 gm trans-fat, <1500 mg sodium  Nutrition Intervention   Pt's individual nutrition plan reviewed with pt.   Benefits of adopting Therapeutic Lifestyle Changes discussed when Medficts reviewed.   Pt to attend the Portion Distortion class   Pt to attend the  ? Nutrition I class - met 01/12/13                    ? Nutrition II class    Continue client-centered nutrition education by RD, as part of interdisciplinary care. Goal(s)   Pt to identify and limit food sources of saturated fat, trans fat, and cholesterol   Pt to identify food quantities necessary to achieve: ? wt loss to a goal wt of 229-247 lb (104.1-112.3 kg) at graduation from cardiac rehab.    Pt able to name foods that affect blood glucose  Monitor and Evaluate progress toward  nutrition goal with team. Nutrition Risk: Change to Moderate Mickle Plumb, M.Ed, RD, LDN, CDE 01/13/2013 7:46 AM

## 2013-01-15 ENCOUNTER — Encounter (HOSPITAL_COMMUNITY)
Admission: RE | Admit: 2013-01-15 | Discharge: 2013-01-15 | Disposition: A | Discharge status: Home / Self Care | Payer: BC Managed Care – PPO | Source: Ambulatory Visit | Attending: Internal Medicine | Admitting: Internal Medicine

## 2013-01-18 ENCOUNTER — Encounter (HOSPITAL_COMMUNITY)
Admission: RE | Admit: 2013-01-18 | Discharge: 2013-01-18 | Disposition: A | Discharge status: Home / Self Care | Payer: BC Managed Care – PPO | Source: Ambulatory Visit | Attending: Internal Medicine | Admitting: Internal Medicine

## 2013-01-19 ENCOUNTER — Ambulatory Visit (HOSPITAL_BASED_OUTPATIENT_CLINIC_OR_DEPARTMENT_OTHER)
Admission: RE | Admit: 2013-01-19 | Discharge: 2013-01-19 | Disposition: A | Discharge status: Home / Self Care | Payer: BC Managed Care – PPO | Source: Ambulatory Visit | Attending: Internal Medicine | Admitting: Internal Medicine

## 2013-01-19 ENCOUNTER — Encounter (HOSPITAL_COMMUNITY): Payer: Self-pay

## 2013-01-19 ENCOUNTER — Ambulatory Visit (HOSPITAL_COMMUNITY)
Admission: RE | Admit: 2013-01-19 | Discharge: 2013-01-19 | Disposition: A | Discharge status: Home / Self Care | Payer: BC Managed Care – PPO | Source: Ambulatory Visit | Attending: Internal Medicine | Admitting: Internal Medicine

## 2013-01-19 ENCOUNTER — Encounter (HOSPITAL_COMMUNITY): Payer: Self-pay | Admitting: *Deleted

## 2013-01-19 VITALS — BP 102/66 | HR 61 | Wt 256.8 lb

## 2013-01-19 DIAGNOSIS — I519 Heart disease, unspecified: Secondary | ICD-10-CM

## 2013-01-19 DIAGNOSIS — I509 Heart failure, unspecified: Secondary | ICD-10-CM | POA: Insufficient documentation

## 2013-01-19 DIAGNOSIS — I5022 Chronic systolic (congestive) heart failure: Secondary | ICD-10-CM

## 2013-01-19 DIAGNOSIS — I2510 Atherosclerotic heart disease of native coronary artery without angina pectoris: Secondary | ICD-10-CM

## 2013-01-19 DIAGNOSIS — I252 Old myocardial infarction: Secondary | ICD-10-CM | POA: Insufficient documentation

## 2013-01-19 DIAGNOSIS — I10 Essential (primary) hypertension: Secondary | ICD-10-CM

## 2013-01-19 MED ORDER — SILDENAFIL CITRATE 100 MG PO TABS: 100.0000 mg | ORAL_TABLET | Freq: Every day | ORAL | Status: DC | PRN

## 2013-01-19 NOTE — Progress Notes (Signed)
Patient ID: Devon [name], male   DOB: 09/23/1960, 52 y.o.   MRN: [id number]   Weight Range  236-239 pounds  Baseline proBNP   PCP: Dr Manson Passey   HPI: Mr. Mazzocco is a 52 year old man with history of DM2, HTN, ETOH, CAD s/p anterior MI 2004 in [place] with cath and stent x 2 placed at that time. 10/2012 DES mid LCX and embolic obstruction OM-2 partially recanalized with angioplasty.   Admitted to [facility] 10/28/12 through 11/03/12 with acute/chronic systolic heart failure. Had cath as noted below with stent. 10/30/12 ECHO 15%. Started on carvedilol 6.25 mg twice daily, spiro 12.5 mg daily, lasix 40 mg daily, and lisinopril 5 mg bid.  D/C weight 236 pounds.   11/03/12 R & LHC RA 12  RV 51/7  PA 50/25  PCWP 23  LVEDP 21  Fick 4.0  Thermo 4.4  Labs 11/05/12 Creatinine 1.0 K 4.0  Labs 11/11/12 Creatinine 1.0 K 4.8 Pro BNP 1451  Labs 11/24/12 Creatinine 0.82 K 4.0 Pro BNP 1066, digoxin 0.7 Labs 12/29/12 Creatinine 1.03 K 4.0 Cholesterol 161 Triglycerides 153 HDL 36 LDL 94  ECHO 01/19/13 EF 50-55%    He returns for follow up. Last visit carvedilol was increased to 12.5 mg twice a day.  He continues to attend cardiac rehab 3 times a week. Denies SOB/PND/Orthopnea. Does admit to mildy dyspnea going up steps.  Compliant with medications. Weight at home 250-251 pounds. Following low salt diet and restricting fluids to less than 2L. Eating lot of snacks. He is not on Ace due to angioedema.   FH: Father CVA brother,sister HTN  SH: Former smoker quit 10/2012. Does not drink alcohol  ROS: All systems negative except as listed in HPI, PMH and Problem List.  Past Medical History  Diagnosis Date  . Coronary artery disease   . Hypertension   . CHF (congestive heart failure)   . Diabetes mellitus without complication     Current Outpatient Prescriptions  Medication Sig Dispense Refill  . aspirin EC 81 MG tablet Take 81 mg by mouth daily.      Marland Kitchen atorvastatin (LIPITOR) 20 MG tablet Take 1 tablet (20 mg  total) by mouth daily at 6 PM.  30 tablet  6  . carvedilol (COREG) 12.5 MG tablet Take 1 tablet (12.5 mg total) by mouth 2 (two) times daily with a meal.  60 tablet  6  . digoxin (LANOXIN) 0.125 MG tablet Take 1 tablet (0.125 mg total) by mouth daily.  30 tablet  6  . furosemide (LASIX) 40 MG tablet Take 1 tablet (40 mg total) by mouth daily.  30 tablet  6  . hydrALAZINE (APRESOLINE) 25 MG tablet Take 1 tablet (25 mg total) by mouth 3 (three) times daily.  90 tablet  3  . isosorbide mononitrate (IMDUR) 30 MG 24 hr tablet Take 1 tablet (30 mg total) by mouth daily.  30 tablet  3  . oxymetazoline (AFRIN) 0.05 % nasal spray Place 2 sprays in left nostril once as needed, for nosebleed.  30 mL  0  . spironolactone (ALDACTONE) 25 MG tablet Take 1 tablet (25 mg total) by mouth daily.  30 tablet  6  . Ticagrelor (BRILINTA) 90 MG TABS tablet Take 1 tablet (90 mg total) by mouth 2 (two) times daily.  60 tablet  6   No current facility-administered medications for this encounter.    Filed Vitals:   01/19/13 0903  BP: 102/66  Pulse: 61  Weight: 256  lb 12.8 oz (116.484 kg)  SpO2: 98%    PHYSICAL EXAM: General:  Well appearing. No resp difficulty Girlfriend present HEENT: normal Neck: supple. JVP flat. Carotids 2+ bilaterally; no bruits. No lymphadenopathy or thryomegaly appreciated. Cor: PMI normal. Regular rate & rhythm. No rubs, gallops or murmurs. Lungs: clear Abdomen: obese, soft, nontender, nondistended. No hepatosplenomegaly. No bruits or masses. Good bowel sounds. Extremities: no cyanosis, clubbing, rash, edema Neuro: alert & orientedx3, cranial nerves grossly intact. Moves all 4 extremities w/o difficulty. Affect pleasant.    1. Chronic Systolic Heart Failure:  ECHO 1/61/09 EF 15%,ICM.  ECHO today with EF recovery noted EF 50-55%.  NYHA I. Volume status stable. Will continue lasix 40 mg daily. - Will increase coreg to 12.5 mg BID. Stop digoxin as EF has recovered.  -Continue   hydralazine 25 mg TID. Will stop Imdur because he is requesting viagra.  - He is not on ACE-I d/t angioedema. - Reinforced the need and importance of daily weights, a low sodium diet, and fluid restriction (less than 2 L a day). Instructed to call the HF clinic if weight increases more than 3 lbs overnight or 5 lbs in a week.  He may return to work next week.   2. CAD: H/O MI in [place]. S/P PCI/stenting of LCX with embolization to OM-2 in 9/14.  No ischemic symptoms. Continue brillinta for 1 year. Continue statin, ASA and BB. Continue cardiac rehab.   3. Hx of tobacco abuse- Quit 10/2012 and continues to abstain   4. HTN: Stable. Continue current medications.   Follow up in 3 months.   Sneha Willig NP-C 9:04 AM

## 2013-01-19 NOTE — Patient Instructions (Addendum)
Follow up in 3 months  Stop digoxin  Stop Imdur  Do the following things EVERYDAY: 1) Weigh yourself in the morning before breakfast. Write it down and keep it in a log. 2) Take your medicines as prescribed 3) Eat low salt foods-Limit salt (sodium) to 2000 mg per day.  4) Stay as active as you can everyday 5) Limit all fluids for the day to less than 2 liters

## 2013-01-19 NOTE — Progress Notes (Signed)
Echo Lab  2D Echocardiogram completed.  Krist Rosenboom L Sanjana Folz, RDCS 01/19/2013 8:46 AM

## 2013-01-20 ENCOUNTER — Encounter (HOSPITAL_COMMUNITY)
Admission: RE | Admit: 2013-01-20 | Discharge: 2013-01-20 | Disposition: A | Discharge status: Home / Self Care | Payer: BC Managed Care – PPO | Source: Ambulatory Visit | Attending: Internal Medicine | Admitting: Internal Medicine

## 2013-01-22 ENCOUNTER — Encounter (HOSPITAL_COMMUNITY)
Admission: RE | Admit: 2013-01-22 | Discharge: 2013-01-22 | Disposition: A | Discharge status: Home / Self Care | Payer: BC Managed Care – PPO | Source: Ambulatory Visit | Attending: Internal Medicine | Admitting: Internal Medicine

## 2013-01-25 ENCOUNTER — Encounter (HOSPITAL_COMMUNITY): Payer: Self-pay | Admitting: Anesthesiology

## 2013-01-25 ENCOUNTER — Encounter (HOSPITAL_COMMUNITY)
Admission: RE | Admit: 2013-01-25 | Discharge: 2013-01-25 | Disposition: A | Discharge status: Home / Self Care | Payer: BC Managed Care – PPO | Source: Ambulatory Visit | Attending: Internal Medicine | Admitting: Internal Medicine

## 2013-01-25 ENCOUNTER — Encounter (HOSPITAL_COMMUNITY): Payer: Self-pay | Admitting: Cardiology

## 2013-01-25 NOTE — Progress Notes (Signed)
Pt in today for cardiac rehab phase II 6:45 am.  Pt commented that he had chest discomfort on yesterday while walking around 4 pm. Pain very similar to discomfort he had during his cath procedure.  Pain lasted 5 minutes of rest  before it resolved.  Pt did not take any NTG. Pt had a second episode while sleeping  Pt awoke with same pain as earlier that resolved after 30 minutes to hour. Pt did not take any NTG.  Upon further assessment pt had discomfort after taking Viagria last week on Wednesday.medication list reconciled.  Recently d/c'd digoxin and imdur.  Of note pt does not have a prn for sl ntg.  Pt presently pain free and has had no occurrence of chest discomfort.  BP 132/68, HR 70 Monitor shows SR with slight ST depression, Weight elevation noted.  Pt up .3kg from Friday but is significantly up from first day of exercise.  Pt weight 118.3 today and his first day of exercise he was 112.7.  Pt with noticeable shortness of breath while talking and placing his monitor on.  Lung sounds with crackles and rales to the right.  Pt complains of feeling swollen in the abdominal area.  Pt not allowed to exercise. Will call heart failure clinic at 8am.

## 2013-01-25 NOTE — Progress Notes (Signed)
Patient ID: Devon [name], male   DOB: April 26, 1960, 52 y.o.   MRN: [id number]  Patient stopped by clinic after CR. He needed a note signed to go back to work which we printed out and gave him. He also reported having some CP that started in CR that lasted 30 min. Last visit his IMDUR was stopped in order for him to have Viagra. Instructed to stop Viagra and to never take with IMDUR and to restart IMDUR. He reports he understands and will start taking again. He does not need refills. HAD LHC 11/03/12: 1. Likely catheter induced thrombotic or atherosclerotic debris to the second obtuse marginal. This occurred during the diagnostic procedure.  2. Attempt at recanalization of the second obtuse marginal was not very successful.  3. Drug-eluting stent implantation for 90% in-stent restenosis of the mid circumflex reducing the obstruction to less than 10% with TIMI grade 3 flow.  Instructed if he starts having CP again to call. He may need repeat LHC. Weight is up as well, however he does not appear to have volume on board. Instructed to take an extra lasix 40 gm today.  Ulla Potash B NP-C 8:50 AM

## 2013-01-27 ENCOUNTER — Encounter (HOSPITAL_COMMUNITY)
Admission: RE | Admit: 2013-01-27 | Discharge: 2013-01-27 | Disposition: A | Discharge status: Home / Self Care | Payer: BC Managed Care – PPO | Source: Ambulatory Visit | Attending: Internal Medicine | Admitting: Internal Medicine

## 2013-01-29 ENCOUNTER — Encounter (HOSPITAL_COMMUNITY)
Admission: RE | Admit: 2013-01-29 | Discharge: 2013-01-29 | Disposition: A | Discharge status: Home / Self Care | Payer: BC Managed Care – PPO | Source: Ambulatory Visit | Attending: Internal Medicine | Admitting: Internal Medicine

## 2013-02-01 ENCOUNTER — Encounter (HOSPITAL_COMMUNITY)
Admission: RE | Admit: 2013-02-01 | Discharge: 2013-02-01 | Disposition: A | Discharge status: Home / Self Care | Payer: BC Managed Care – PPO | Source: Ambulatory Visit | Attending: Internal Medicine | Admitting: Internal Medicine

## 2013-02-03 ENCOUNTER — Encounter (HOSPITAL_COMMUNITY)
Admission: RE | Admit: 2013-02-03 | Discharge: 2013-02-03 | Disposition: A | Discharge status: Home / Self Care | Payer: BC Managed Care – PPO | Source: Ambulatory Visit | Attending: Internal Medicine | Admitting: Internal Medicine

## 2013-02-15 ENCOUNTER — Encounter (HOSPITAL_COMMUNITY)
Admission: RE | Admit: 2013-02-15 | Discharge: 2013-02-15 | Disposition: A | Discharge status: Home / Self Care | Payer: BC Managed Care – PPO | Source: Ambulatory Visit | Attending: Internal Medicine | Admitting: Internal Medicine

## 2013-02-15 DIAGNOSIS — I509 Heart failure, unspecified: Secondary | ICD-10-CM | POA: Insufficient documentation

## 2013-02-15 DIAGNOSIS — Z9861 Coronary angioplasty status: Secondary | ICD-10-CM | POA: Insufficient documentation

## 2013-02-15 DIAGNOSIS — I252 Old myocardial infarction: Secondary | ICD-10-CM | POA: Insufficient documentation

## 2013-02-15 DIAGNOSIS — I2510 Atherosclerotic heart disease of native coronary artery without angina pectoris: Secondary | ICD-10-CM | POA: Insufficient documentation

## 2013-02-15 DIAGNOSIS — I5022 Chronic systolic (congestive) heart failure: Secondary | ICD-10-CM | POA: Insufficient documentation

## 2013-02-15 DIAGNOSIS — Z5189 Encounter for other specified aftercare: Secondary | ICD-10-CM | POA: Insufficient documentation

## 2013-02-17 ENCOUNTER — Encounter (HOSPITAL_COMMUNITY): Payer: BC Managed Care – PPO

## 2013-02-19 ENCOUNTER — Encounter (HOSPITAL_COMMUNITY)
Admission: RE | Admit: 2013-02-19 | Discharge: 2013-02-19 | Disposition: A | Discharge status: Home / Self Care | Payer: BC Managed Care – PPO | Source: Ambulatory Visit | Attending: Internal Medicine | Admitting: Internal Medicine

## 2013-02-22 ENCOUNTER — Encounter (HOSPITAL_COMMUNITY)
Admission: RE | Admit: 2013-02-22 | Discharge: 2013-02-22 | Disposition: A | Discharge status: Home / Self Care | Payer: BC Managed Care – PPO | Source: Ambulatory Visit | Attending: Internal Medicine | Admitting: Internal Medicine

## 2013-02-24 ENCOUNTER — Encounter (HOSPITAL_COMMUNITY)
Admission: RE | Admit: 2013-02-24 | Discharge: 2013-02-24 | Disposition: A | Discharge status: Home / Self Care | Payer: BC Managed Care – PPO | Source: Ambulatory Visit | Attending: Internal Medicine | Admitting: Internal Medicine

## 2013-02-26 ENCOUNTER — Encounter (HOSPITAL_COMMUNITY): Payer: BC Managed Care – PPO

## 2013-03-01 ENCOUNTER — Encounter (HOSPITAL_COMMUNITY): Payer: BC Managed Care – PPO

## 2013-03-03 ENCOUNTER — Encounter (HOSPITAL_COMMUNITY)
Admission: RE | Admit: 2013-03-03 | Discharge: 2013-03-03 | Disposition: A | Discharge status: Home / Self Care | Payer: BC Managed Care – PPO | Source: Ambulatory Visit | Attending: Internal Medicine | Admitting: Internal Medicine

## 2013-03-05 ENCOUNTER — Encounter: Payer: Self-pay | Admitting: Internal Medicine

## 2013-03-05 ENCOUNTER — Telehealth (HOSPITAL_COMMUNITY): Payer: Self-pay | Admitting: *Deleted

## 2013-03-05 ENCOUNTER — Encounter (HOSPITAL_COMMUNITY)
Admission: RE | Admit: 2013-03-05 | Discharge: 2013-03-05 | Disposition: A | Discharge status: Home / Self Care | Payer: BC Managed Care – PPO | Source: Ambulatory Visit | Attending: Internal Medicine | Admitting: Internal Medicine

## 2013-03-05 ENCOUNTER — Telehealth: Payer: Self-pay | Admitting: *Deleted

## 2013-03-05 ENCOUNTER — Ambulatory Visit (INDEPENDENT_AMBULATORY_CARE_PROVIDER_SITE_OTHER): Payer: BC Managed Care – PPO | Admitting: Internal Medicine

## 2013-03-05 VITALS — BP 112/75 | HR 91 | Temp 97.7°F | Ht 71.0 in | Wt 252.8 lb

## 2013-03-05 DIAGNOSIS — R7309 Other abnormal glucose: Secondary | ICD-10-CM

## 2013-03-05 DIAGNOSIS — R739 Hyperglycemia, unspecified: Secondary | ICD-10-CM

## 2013-03-05 DIAGNOSIS — E119 Type 2 diabetes mellitus without complications: Secondary | ICD-10-CM

## 2013-03-05 LAB — BASIC METABOLIC PANEL
BUN: 23 mg/dL (ref 6–23)
CALCIUM: 9.9 mg/dL (ref 8.4–10.5)
CHLORIDE: 92 meq/L — AB (ref 96–112)
CO2: 25 mEq/L (ref 19–32)
Creat: 1.53 mg/dL — ABNORMAL HIGH (ref 0.50–1.35)
Glucose, Bld: 574 mg/dL (ref 70–99)
Potassium: 4.8 mEq/L (ref 3.5–5.3)
SODIUM: 132 meq/L — AB (ref 135–145)

## 2013-03-05 LAB — HEPATIC FUNCTION PANEL
ALBUMIN: 4 g/dL (ref 3.5–5.2)
ALT: 22 U/L (ref 0–53)
AST: 16 U/L (ref 0–37)
Alkaline Phosphatase: 162 U/L — ABNORMAL HIGH (ref 39–117)
Total Bilirubin: 0.7 mg/dL (ref 0.3–1.2)
Total Protein: 7.6 g/dL (ref 6.0–8.3)

## 2013-03-05 LAB — GLUCOSE, CAPILLARY
GLUCOSE-CAPILLARY: 398 mg/dL — AB (ref 70–99)
Glucose-Capillary: 479 mg/dL — ABNORMAL HIGH (ref 70–99)
Glucose-Capillary: 562 mg/dL (ref 70–99)
Glucose-Capillary: 495 mg/dL — ABNORMAL HIGH (ref 70–99)
Glucose-Capillary: 534 mg/dL — ABNORMAL HIGH (ref 70–99)
Glucose-Capillary: 342 mg/dL — ABNORMAL HIGH (ref 70–99)

## 2013-03-05 LAB — POCT GLYCOSYLATED HEMOGLOBIN (HGB A1C): HEMOGLOBIN A1C: 12.8

## 2013-03-05 MED ORDER — SODIUM CHLORIDE 0.9 % IV BOLUS (SEPSIS)
1000.0000 mL | Freq: Once | INTRAVENOUS | Status: AC
  Administered 2013-03-05: 1000 mL via INTRAVENOUS

## 2013-03-05 MED ORDER — INSULIN ASPART 100 UNIT/ML ~~LOC~~ SOLN
15.0000 [IU] | Freq: Once | SUBCUTANEOUS | Status: AC
  Administered 2013-03-05: 15 [IU] via SUBCUTANEOUS

## 2013-03-05 MED ORDER — METFORMIN HCL 500 MG PO TABS: 500.0000 mg | ORAL_TABLET | Freq: Every day | ORAL | Status: DC

## 2013-03-05 NOTE — Progress Notes (Signed)
Heather from Heart failure clinic called.  Asked if pt was still there.  Informed her that he just left to go home.  Herbert SetaHeather will call him to give him the option of going to his primary MD today if not able to get an appt . To go to urgent care for evaluation.

## 2013-03-05 NOTE — Telephone Encounter (Signed)
I called and sch pt appt with internal med today at 1 pm, pt's girlfriend aware and states she will have him there

## 2013-03-05 NOTE — Progress Notes (Addendum)
Called Heart failure clinic.  answering service in charge of calls.  Called back at 0815 and spoke to Ple[place] RunMegan.  Relayed information about today's events 479 blood sugar, HgA1C 6.2 on 9/18. Informed her that the internal medicine clinic where pt has an appointment on 2/2 did not open until 9:30 on Fridays.  Heart failure clinic will notify clinic to see if he could be worked in today for further evaluation.    Pt felt better after resting in treatment room.  Rechecked blood glucose after completely drinking 12 oz water bottle. Repeat check 562.  Pt adamant about not going to ER for evaluation.  Explained to pt the dangers of safety with blood glucose readings that high.  Pt stated that I have felt lousy all week and have had no problems driving or any visual disturbances. Asked if I may call his girlfriend he replied no I will call her.  Pt informed staff that he planned to go home to get her (they only have one car) and then come back to ER.  Again strongly advised pt to allow us to transport to ER.  Pt waved staff off and proceeded to the door saying I will be all right I know what I am going to do.

## 2013-03-05 NOTE — Telephone Encounter (Signed)
Heather rn from heart failure clinic calls, dr bensimhon [facility], pt's blood sugar today is 400+, dry mouth and polyuria. Pt has gone home to get his girlfriend. Herbert SetaHeather will call pt and tell him of appt at 1315 today w/ dr Burtis Junessadek

## 2013-03-05 NOTE — Assessment & Plan Note (Addendum)
Pt hyperglycemic 534 in clinic. Pt was provided with adequate oral hydration, 500cc NS bolus given systolic HF and a total 30 units of novolog recheck CBG was 324. Pt only had a small AG of 19 and normal bicarb -cmet  -500 cc NS bolus -30 U Novolog

## 2013-03-05 NOTE — Progress Notes (Signed)
   Subjective:    Patient ID: Devon [name], male    DOB: 1960/05/15, 53 y.o.   MRN: [id number]  HPI [name] is a 53 yo man pmh as listed below presents from HF clinic with hyperglycemia. Pt is accompanied by his fiancee.   Pt was at cardiac rehab and felt extremely weak and tired and a POC CBG was taken with reading of 562. The pt was then sent to clinic for evaluation. Pt states that over the past 24-48 hrs he has had marked increase in thirst and urination. No sick contacts, increase in weight, no increase of SOB from baseline, and no CP. Never had a diagnosis of DM but all family members have a history. Pt has also had a dry throat and constipation that was slightly relieved with milk of magnesium. Pt has not had any blurry vision or numbness/tingling in his feet or other extremities. Pts fiancee states he has been his usual self with no abberations in behavior but only slight more sleepy today. Last meal was lunch consisted of tuna in water and crackers with sprite zero. Pt tolerated meal without nausea or vomiting. Breakfast included eggs, bacon, and toast with juice.    Review of Systems  Constitutional: Positive for activity change (felt more weak lately especially at cardiac rehab) and fatigue. Negative for fever, chills, diaphoresis, appetite change and unexpected weight change.  Eyes: Negative for visual disturbance.  Respiratory: Positive for shortness of breath (nothing above baseline). Negative for cough and chest tightness.   Cardiovascular: Negative for chest pain, palpitations and leg swelling.  Gastrointestinal: Positive for constipation (relieved with some milk of magnesium). Negative for nausea, vomiting, abdominal pain, diarrhea and abdominal distention.  Endocrine: Positive for polydipsia and polyuria.  Genitourinary: Positive for frequency. Negative for urgency, hematuria and decreased urine volume.  Musculoskeletal: Negative for arthralgias, back pain and myalgias.    Neurological: Positive for dizziness (today during cardiac rehab). Negative for weakness, light-headedness, numbness and headaches.  Psychiatric/Behavioral: Negative for confusion and agitation.    Past Medical History  Diagnosis Date  . Coronary artery disease   . Hypertension   . CHF (congestive heart failure)   . Diabetes mellitus without complication    Social, surgical, family history reviewed with patient and updated in appropriate chart locations.     Objective:   Physical Exam Filed Vitals:   03/05/13 1314  BP: 112/75  Pulse: 91  Temp: 97.7 F (36.5 C)  General: sitting in chair, NAD, tired appearing HEENT: PERRL, EOMI, no scleral icterus Cardiac: RRR, no rubs, murmurs or gallops Pulm: clear to auscultation bilaterally, no crackles wheezes or rhonchi, no decreased BS, moving normal volumes of air Abd: soft, nontender, nondistended, BS present Ext: warm and well perfused, no pedal edema Neuro: alert and oriented X3, cranial nerves II-XII grossly intact    Assessment & Plan:  Please see problem oriented charting  Pt discussed with Dr. Rogelia BogaButcher

## 2013-03-05 NOTE — Patient Instructions (Signed)
We saw you for you high sugar readings that have been happening recently and made you feel weak.   We confirmed today that you do have Diabetes.  -start new medicine called Metformin as follows 1. Week 1 take Metformin 500 mg with dinner 2. Week 2 take metformin 500 mg with dinner and breakfast 3. Week 3 take metformin 500 mg with breakfast and 1000 (2 pills) with dinner 4.Week 4 take metformin 1000mg  (2 pills) with breakfast and dinner 5. Follow up appointment    8 Tips for Caregivers  From Diabetes Forecast By Irven Shelling March 2014  A diabetes diagnosis can be overwhelming. As caregivers, we want to support our loved ones and help them enjoy the healthiest lives possible. But what is the best way to do that? We talked to experts-both health care providers and people who have been there-to learn what is helpful and what to avoid.  Start Your Education Now  Any diagnosis presents a learning curve. Your first and best step in becoming an ally for your loved one is to arm yourself with education, says Julienne Kass, MD, of [facility], a [place] health care and social services center for low-income families.  Some people believe that diabetes is "not a big deal" or, alternately, that it's a death sentence, so it's important to know the facts. "As physicians, we try to debunk [misconceptions], but information coming from a trusted family member or friend is really powerful," Nundy says. "[Diabetes is] something you can live a long, healthy life with, by managing it."  You can ask your loved one's health care provider about where to learn more, including books, online communities, support groups, and sites such as the American Diabetes Association's diabetes.org.  The learning curve can be steep, says Randolm Idol, whose 67-year-old son, Audelia Acton, was diagnosed with type 1 seven years ago, but she says it has been manageable for her and him. "My advice is to take a deep breath  and realize that the beginning is the worst," Simms says. "Now it's just our routine, and it will become yours as well, eventually."  Take Some Time  You can learn things and make changes bit by bit, to avoid overhauling your lives based on a loved one's diabetes.  Quinn Plowman, PhD, should know. She's a psychologist and five-time cancer survivor who wrote a book on coping with a new diagnosis: AfterShock: What to Do When the Doctor Gives You-or Someone You Love-a Devastating Diagnosis. She suggests giving yourself and your loved one time to digest the information. "People are shocked, and they feel like their life has changed in these earth-shattering ways," she says. "As we learn more, as time goes by, as we adjust, as we make decisions, the way we feel changes a lot."  So don't rush your loved one (or yourself) from mourning to acceptance. Instead of saying, "You're not going to feel this way tomorrow," it may be more helpful to say, "This is really scary. What are you most worried about?  Encourage Self-Care, but Don't Be a Pest  There's a fine line between checking in on someone's well-being and what Dallie Piles, PhD, a psychologist with the [facility] ([place]) health care system, calls "miscarried helping"-also known as nagging.  "Though people really do want to help their loved ones with diabetes, this backfires and just sends people running in the opposite direction," he says. Don't pester: Explain what you would appreciate your loved one doing. Stefanie Libel suggests role-playing these requests  with health care providers or a diabetes care team to make sure your approach will be well-received.  Of course, with children, you will have to supervise, so weigh how much they might be able to handle on their own, advises Fransico Setters, a member of the Diabetes Forecast Reader Panel. Her daughters both have type 1 diabetes. "Give them one task at a time, and give them time to be successful at it," she  suggests. "Also be ready to take some responsibilities back from your child if you see them struggling."  Preteen and teenage children often need parental supervision to stay consistent with care.  Make Changes Together  Your loved one's diagnosis probably means making some lifestyle changes. Going through that alone might feel isolating, so why not make the changes together as a team or household? Start exercising together or look for diabetes-friendly recipes together-then cook and eat them together.  "A lot of the things that improve the care for someone with diabetes are actually good for everybody," Nundy says. "It's always better than going it alone." Nundy knows this firsthand: He's helped his mom care for her type 2 diabetes.  Set Small Goals  Taking a step-by-step approach is the easiest way to make permanent lifestyle changes, says Corliss Blacker, RD, LD, of the [place]. Doing small things, such as taking a walk after dinner, can improve blood glucose and overall diabetes management, and allow you to look at the results and reevaluate as needed. "I think that's very motivating for the patient, and they can continue to move forward," Koustis says.  Offer help only if you really mean it. Saying "let me do anything I can to help you" is so broad, most people won't take you up on it. So be specific about what you're able to help with, and offer only if you really can help, says Gruman. "There's nothing harder than to ask for help and then have it refused," she says. So can you give your loved one a ride to the doctor? Then offer that-it'll be appreciated.  Work With the Diabetes Care Team  Attend doctor's appointments and diabetes education classes together if your loved one agrees. Nundy suggests listening to what both health care providers and the patient are saying, chiming in with what you know, and asking questions to help your loved one get the best care possible. That includes making  sure the health care team is complete, including, say, a dietitian or a mental health counselor, if needed.  "Often doctors don't know that patients are having trouble with their medications or [aren't] able to follow a diet plan, and patients are often reluctant to share this information with the doctor or simply are too overwhelmed with their care," Nundy says. "Caregivers can advocate for their loved ones simply by listening and sharing with the doctor, and then trusting them to make the right decisions that reflect their loved ones' needs."  Find Support for Yourself  The best way to be a caregiver is to take care of yourself, too. "Not only does the patient feel stress, but I think it's important to explain that the caregiver can feel the stress," says Leontine Locket, PharmD, [place], who works with Stefanie Libel and Koustis at the [place]. "Acknowledging it can help with coping with it." If you can find a support group for caregivers, so much the better. Delorse Limber was able to meet other parents of children with diabetes after her son, Marcello Moores, was diagnosed with type 1 diabetes in  2012. "It helps so much to meet others who are going through exactly what you are dealing with," Andrey Campanile says. "We need to hug each other and take comfort in the support we can offer one another."  - See more at: http://www.diabetes.org/living-with-diabetes/recently-diagnosed/8-tips-for-caregivers.html#sthash.WkwoG2rp.dpufDiabetes Meal Planning Guide The diabetes meal planning guide is a tool to help you plan your meals and snacks. It is important for people with diabetes to manage their blood glucose (sugar) levels. Choosing the right foods and the right amounts throughout your day will help control your blood glucose. Eating right can even help you improve your blood pressure and reach or maintain a healthy weight. CARBOHYDRATE COUNTING MADE EASY When you eat carbohydrates, they turn to sugar. This raises your blood glucose  level. Counting carbohydrates can help you control this level so you feel better. When you plan your meals by counting carbohydrates, you can have more flexibility in what you eat and balance your medicine with your food intake. Carbohydrate counting simply means adding up the total amount of carbohydrate grams in your meals and snacks. Try to eat about the same amount at each meal. Foods with carbohydrates are listed below. Each portion below is 1 carbohydrate serving or 15 grams of carbohydrates. Ask your dietician how many grams of carbohydrates you should eat at each meal or snack. Grains and Starches  1 slice bread.   English muffin or hotdog/hamburger bun.   cup cold cereal (unsweetened).   cup cooked pasta or rice.   cup starchy vegetables (corn, potatoes, peas, beans, winter squash).  1 tortilla (6 inches).   bagel.  1 waffle or pancake (size of a CD).   cup cooked cereal.  4 to 6 small crackers. *Whole grain is recommended. Fruit  1 cup fresh unsweetened berries, melon, papaya, pineapple.  1 small fresh fruit.   banana or mango.   cup fruit juice (4 oz unsweetened).   cup canned fruit in natural juice or water.  2 tbs dried fruit.  12 to 15 grapes or cherries. Milk and Yogurt  1 cup fat-free or 1% milk.  1 cup soy milk.  6 oz light yogurt with sugar-free sweetener.  6 oz low-fat soy yogurt.  6 oz plain yogurt. Vegetables  1 cup raw or  cup cooked is counted as 0 carbohydrates or a "free" food.  If you eat 3 or more servings at 1 meal, count them as 1 carbohydrate serving. Other Carbohydrates   oz chips or pretzels.   cup ice cream or frozen yogurt.   cup sherbet or sorbet.  2 inch square cake, no frosting.  1 tbs honey, sugar, jam, jelly, or syrup.  2 small cookies.  3 squares of graham crackers.  3 cups popcorn.  6 crackers.  1 cup broth-based soup.  Count 1 cup casserole or other mixed foods as 2 carbohydrate  servings.  Foods with less than 20 calories in a serving may be counted as 0 carbohydrates or a "free" food. You may want to purchase a book or computer software that lists the carbohydrate gram counts of different foods. In addition, the nutrition facts panel on the labels of the foods you eat are a good source of this information. The label will tell you how big the serving size is and the total number of carbohydrate grams you will be eating per serving. Divide this number by 15 to obtain the number of carbohydrate servings in a portion. Remember, 1 carbohydrate serving equals 15 grams of carbohydrate. SERVING SIZES  Measuring foods and serving sizes helps you make sure you are getting the right amount of food. The list below tells how big or small some common serving sizes are.  1 oz.........4 stacked dice.  3 oz........Marland Kitchen.Deck of cards.  1 tsp.......Marland Kitchen.Tip of little finger.  1 tbs......Marland Kitchen.Marland Kitchen.Thumb.  2 tbs.......Marland Kitchen.Golf ball.   cup......Marland Kitchen.Half of a fist.  1 cup.......Marland Kitchen.A fist. SAMPLE DIABETES MEAL PLAN Below is a sample meal plan that includes foods from the grain and starches, dairy, vegetable, fruit, and meat groups. A dietician can individualize a meal plan to fit your calorie needs and tell you the number of servings needed from each food group. However, controlling the total amount of carbohydrates in your meal or snack is more important than making sure you include all of the food groups at every meal. You may interchange carbohydrate containing foods (dairy, starches, and fruits). The meal plan below is an example of a 2000 calorie diet using carbohydrate counting. This meal plan has 17 carbohydrate servings. Breakfast  1 cup oatmeal (2 carb servings).   cup light yogurt (1 carb serving).  1 cup blueberries (1 carb serving).   cup almonds. Snack  1 large apple (2 carb servings).  1 low-fat string cheese stick. Lunch  Chicken breast salad.  1 cup spinach.   cup chopped  tomatoes.  2 oz chicken breast, sliced.  2 tbs low-fat [place] dressing.  12 whole-wheat crackers (2 carb servings).  12 to 15 grapes (1 carb serving).  1 cup low-fat milk (1 carb serving). Snack  1 cup carrots.   cup hummus (1 carb serving). Dinner  3 oz broiled salmon.  1 cup brown rice (3 carb servings). Snack  1  cups steamed broccoli (1 carb serving) drizzled with 1 tsp olive oil and lemon juice.  1 cup light pudding (2 carb servings). DIABETES MEAL PLANNING WORKSHEET Your dietician can use this worksheet to help you decide how many servings of foods and what types of foods are right for you.  BREAKFAST Food Group and Servings / Carb Servings Grain/Starches __________________________________ Dairy __________________________________________ Vegetable ______________________________________ Fruit ___________________________________________ Meat __________________________________________ Fat ____________________________________________ LUNCH Food Group and Servings / Carb Servings Grain/Starches ___________________________________ Dairy ___________________________________________ Fruit ____________________________________________ Meat ___________________________________________ Fat _____________________________________________ Laural GoldenINNER Food Group and Servings / Carb Servings Grain/Starches ___________________________________ Dairy ___________________________________________ Fruit ____________________________________________ Meat ___________________________________________ Fat _____________________________________________ SNACKS Food Group and Servings / Carb Servings Grain/Starches ___________________________________ Dairy ___________________________________________ Vegetable _______________________________________ Fruit ____________________________________________ Meat ___________________________________________ Fat  _____________________________________________ DAILY TOTALS Starches _________________________ Vegetable ________________________ Fruit ____________________________ Dairy ____________________________ Meat ____________________________ Fat ______________________________ Document Released: 10/25/2004 Document Revised: 04/22/2011 Document Reviewed: 09/05/2008 [employer] Patient Information 2014 [employer].

## 2013-03-05 NOTE — Progress Notes (Signed)
Pt presents to cardiac rehab at 6:45 exercise class.  Pt complained of feeling like he had no energy.  Pt stated that he did not eat any breakfast this morning. Pt was observed in the waiting area drinking coffee when staff arrived for work.  Pt given crackers and peanut butter along with lemonade.  Pt commented that he went to the doctor on Wednesday with the complaint of dry mouth.  Pt was told that he would be referred to "a doctor for diabetes".  Pt asked to have his blood glucose checked.  Pt blood glucose was 479.  Pt instructed to stop drinking the lemonade.  Pt had about 1/4 cup.  Pt did not add any sugar to his coffee this morning.  Pt checked for ketones negative, Pt given H20 to drink.  Will contact pt primary MD internal med and Heart Failure MD at 8 am when the clinic opens.

## 2013-03-05 NOTE — Telephone Encounter (Signed)
Received call from [place], RN in Cardiac Rehab, she states when pt came in this AM he c/o dry mouth and not feeling well, they checked his CBG and it was over 400, they recommend pt go to ER but he refused and stated he was going home to get his girlfriend and then would go, she just wanted us to know what was going on, pt is sch with internal medicine on 2/2.  Discussed w/Dr Bensimhon he recommends pt either be seen by internal med today or he can go to urgent care, I called and spoke w/pt he is agreeable with doing that, he is going to call internal med today if not able to see him he will go to urgent care

## 2013-03-05 NOTE — Progress Notes (Signed)
Rehab staff called pt.  Pt pulling into driveway. Pt did receive call from heather at the heart failure clinic.  Pt will talk to girlfriend and decide which option he would do.  Follow up with internal med clinic with appt today, Urgent care or ER.  Will check back with pt later today.

## 2013-03-05 NOTE — Telephone Encounter (Signed)
agree

## 2013-03-05 NOTE — Assessment & Plan Note (Addendum)
This is a new diagnosis of DM most likely type 2 for the patient. Extensive family hx of diabetes. A new meter was provided during visit  -metformin taper to 1000mg  BID -f/u in 2 wks -urine microalbumin, UA -DM resources given -DM educator referral

## 2013-03-06 LAB — MICROALBUMIN / CREATININE URINE RATIO
CREATININE, URINE: 46.8 mg/dL
Microalb Creat Ratio: 12.2 mg/g (ref 0.0–30.0)
Microalb, Ur: 0.57 mg/dL (ref 0.00–1.89)

## 2013-03-06 LAB — URINALYSIS, ROUTINE W REFLEX MICROSCOPIC
Bilirubin Urine: NEGATIVE
Glucose, UA: >1000 mg/dL — AB
HGB URINE DIPSTICK: NEGATIVE
Ketones, ur: NEGATIVE mg/dL
Leukocytes, UA: NEGATIVE
NITRITE: NEGATIVE
PH: 5 (ref 5.0–8.0)
PROTEIN: NEGATIVE mg/dL
Specific Gravity, Urine: 1.025 (ref 1.005–1.030)
Urobilinogen, UA: 0.2 mg/dL (ref 0.0–1.0)

## 2013-03-06 LAB — URINALYSIS, MICROSCOPIC ONLY
Bacteria, UA: NONE SEEN
CASTS: NONE SEEN
Crystals: NONE SEEN
SQUAMOUS EPITHELIAL / LPF: NONE SEEN

## 2013-03-08 ENCOUNTER — Encounter (HOSPITAL_COMMUNITY)
Admission: RE | Admit: 2013-03-08 | Discharge: 2013-03-08 | Disposition: A | Discharge status: Home / Self Care | Payer: BC Managed Care – PPO | Source: Ambulatory Visit | Attending: Internal Medicine | Admitting: Internal Medicine

## 2013-03-08 ENCOUNTER — Telehealth: Payer: Self-pay | Admitting: *Deleted

## 2013-03-08 LAB — GLUCOSE, CAPILLARY: GLUCOSE-CAPILLARY: 503 mg/dL — AB (ref 70–99)

## 2013-03-08 NOTE — Telephone Encounter (Signed)
[name] called [phone] - checked pt CBG this AM 503. On Metformin daily for one week. Talked with Devon [name] - continue with med as outline, monitor CBG and watch diet intake. Talked with pt and is aware. Pt states feeling fine. Will try to monitor CBG - did not do over the weekend. Talked with Devon [name] after talking with pt. Devon KidneyDebra Zyah Gomm RN 03/08/13 9:30AM

## 2013-03-08 NOTE — Progress Notes (Signed)
Pt arrived at cardiac rehab CBG-503.  Pt asymptomatic.  Urine negative for ketones.  Pt given 8 oz water to drink, pt reports he drank 12 oz water prior to arrival at cardiac rehab.  Pt did not exercise.  Pt states he was started on metformin 500mg  qPM and he reports he is taking as prescribed.  Pt has not been taking blood sugars at home despite being given glucometer for home use.  Pt instructed to check CBG BID.  Phone call to Internal Medicine Clinic,  left message on triage nurse voicemail.

## 2013-03-09 ENCOUNTER — Telehealth (HOSPITAL_COMMUNITY): Payer: Self-pay | Admitting: Cardiac Rehabilitation

## 2013-03-09 NOTE — Progress Notes (Signed)
Case discussed with Dr. Sadek soon after the resident saw the patient.  We reviewed the resident's history and exam and pertinent patient test results.  I agree with the assessment, diagnosis, and plan of care documented in the resident's note. 

## 2013-03-09 NOTE — Telephone Encounter (Signed)
pc to pt to assess CBG readings today.  Pt states he has not checked blood sugar today.  Pt instructed to check CBG at least once daily.  Continue current regimen as instructed per PCP.  Pt instructed to check CBG in AM prior to coming to cardiac rehab. If >300, do not come to rehab.  Understanding verbalized

## 2013-03-10 ENCOUNTER — Encounter (HOSPITAL_COMMUNITY)
Admission: RE | Admit: 2013-03-10 | Payer: BC Managed Care – PPO | Source: Ambulatory Visit | Attending: Cardiology | Admitting: Cardiology

## 2013-03-12 ENCOUNTER — Encounter (HOSPITAL_COMMUNITY)
Admission: RE | Admit: 2013-03-12 | Discharge: 2013-03-12 | Disposition: A | Discharge status: Home / Self Care | Payer: BC Managed Care – PPO | Source: Ambulatory Visit | Attending: Internal Medicine | Admitting: Internal Medicine

## 2013-03-12 LAB — GLUCOSE, CAPILLARY: Glucose-Capillary: 300 mg/dL — ABNORMAL HIGH (ref 70–99)

## 2013-03-12 NOTE — Progress Notes (Signed)
Pt returned to cardiac rehab today.  Pt was absent on Wednesday.  Inquired regarding his readings at home today.  Pt did not take his blood sugar this morning and replied that he felt good.  Pt remarked that he tried to check his blood glucose at home and got a error reading.  Pt reports that he had a reading of 289 but couldn't remember what day it was.Pt has a follow up appt on Monday at the clinic and plans to bring his meter then for re instruction.  Pt pre exercise blood glucose checked 300.  Per Cardiac rehab policy,pt will not be able to participate in exercise. Pt given H20 to drink, negative ketones.  Pt will start metformin 1000 mg on tomorrow..  Will follow up on Monday with his next exercise session

## 2013-03-15 ENCOUNTER — Encounter: Payer: Self-pay | Admitting: Dietician

## 2013-03-15 ENCOUNTER — Other Ambulatory Visit: Payer: Self-pay | Admitting: Dietician

## 2013-03-15 ENCOUNTER — Encounter (HOSPITAL_COMMUNITY)
Admission: RE | Admit: 2013-03-15 | Discharge: 2013-03-15 | Disposition: A | Discharge status: Home / Self Care | Payer: BC Managed Care – PPO | Source: Ambulatory Visit | Attending: Internal Medicine | Admitting: Internal Medicine

## 2013-03-15 ENCOUNTER — Encounter: Payer: Self-pay | Admitting: Internal Medicine

## 2013-03-15 ENCOUNTER — Encounter: Payer: BC Managed Care – PPO | Admitting: Internal Medicine

## 2013-03-15 ENCOUNTER — Ambulatory Visit (INDEPENDENT_AMBULATORY_CARE_PROVIDER_SITE_OTHER): Payer: BC Managed Care – PPO | Admitting: Internal Medicine

## 2013-03-15 ENCOUNTER — Ambulatory Visit: Payer: BC Managed Care – PPO | Admitting: Dietician

## 2013-03-15 VITALS — BP 126/79 | HR 79 | Temp 97.0°F | Ht 71.0 in | Wt 253.3 lb

## 2013-03-15 DIAGNOSIS — I252 Old myocardial infarction: Secondary | ICD-10-CM | POA: Insufficient documentation

## 2013-03-15 DIAGNOSIS — I10 Essential (primary) hypertension: Secondary | ICD-10-CM

## 2013-03-15 DIAGNOSIS — I5022 Chronic systolic (congestive) heart failure: Secondary | ICD-10-CM

## 2013-03-15 DIAGNOSIS — I509 Heart failure, unspecified: Secondary | ICD-10-CM | POA: Insufficient documentation

## 2013-03-15 DIAGNOSIS — E119 Type 2 diabetes mellitus without complications: Secondary | ICD-10-CM

## 2013-03-15 DIAGNOSIS — Z5189 Encounter for other specified aftercare: Secondary | ICD-10-CM | POA: Insufficient documentation

## 2013-03-15 DIAGNOSIS — Z0000 Encounter for general adult medical examination without abnormal findings: Secondary | ICD-10-CM | POA: Insufficient documentation

## 2013-03-15 DIAGNOSIS — I2510 Atherosclerotic heart disease of native coronary artery without angina pectoris: Secondary | ICD-10-CM | POA: Insufficient documentation

## 2013-03-15 DIAGNOSIS — Z9861 Coronary angioplasty status: Secondary | ICD-10-CM | POA: Insufficient documentation

## 2013-03-15 LAB — BASIC METABOLIC PANEL WITH GFR
BUN: 20 mg/dL (ref 6–23)
CALCIUM: 9.7 mg/dL (ref 8.4–10.5)
CHLORIDE: 95 meq/L — AB (ref 96–112)
CO2: 27 meq/L (ref 19–32)
Creat: 1.6 mg/dL — ABNORMAL HIGH (ref 0.50–1.35)
GFR, Est African American: 56 mL/min — ABNORMAL LOW
GFR, Est Non African American: 48 mL/min — ABNORMAL LOW
Glucose, Bld: 267 mg/dL — ABNORMAL HIGH (ref 70–99)
Potassium: 4.1 mEq/L (ref 3.5–5.3)
SODIUM: 135 meq/L (ref 135–145)

## 2013-03-15 LAB — HM DIABETES EYE EXAM

## 2013-03-15 LAB — GLUCOSE, CAPILLARY: Glucose-Capillary: 289 mg/dL — ABNORMAL HIGH (ref 70–99)

## 2013-03-15 MED ORDER — HYDRALAZINE HCL 25 MG PO TABS: 25.0000 mg | ORAL_TABLET | Freq: Three times a day (TID) | ORAL | Status: DC

## 2013-03-15 MED ORDER — ISOSORBIDE MONONITRATE ER 30 MG PO TB24: 30.0000 mg | ORAL_TABLET | Freq: Every day | ORAL | Status: DC

## 2013-03-15 MED ORDER — ACCU-CHEK MULTICLIX LANCETS MISC: Status: DC

## 2013-03-15 MED ORDER — GLUCOSE BLOOD VI STRP: ORAL_STRIP | Status: DC

## 2013-03-15 NOTE — Assessment & Plan Note (Signed)
EF 50-55% with grade 1 diastolic dysfunction by 2-D echo on 01/19/13. Body weight is stable, 252 pounds on 03/05/13-->253 pounds today. Clinically euvolemic,  -will continue current regimen, including Lasix 40 mg daily, Coreg, aspirin and spironolactone.  -will check BMP today.

## 2013-03-15 NOTE — Progress Notes (Signed)
Retinal exam done and transmitted.  Patient with newly diagnosed diabetes. He and wife report that they are planning to attend Cardiac rehab classes in the next few weeks and denied need to meet with CDE today. Encouraged them to make appointment with me after cardiac rehab classes are finished to be sure all diabetes self management curricula are covered. They verbalized understanding.

## 2013-03-15 NOTE — Assessment & Plan Note (Addendum)
-  did foot exam, has good pulses -Refused pneumococcal vaccination today, will postpone -because of Brillinta use, patient's colonoscopy was postponed to Sept. 2015 per pt.  -retinal picture was taken in clinic today.

## 2013-03-15 NOTE — Progress Notes (Addendum)
Pt returned to Cardiac Rehab today.  Pt increased metformin to 500mg  twice a day.  Pt had high protein, high fiber and low sugar 1gm for breakfast today along with 2% milk.  Pt pre exercise blood glucose 476.  Pt appeared discouraged and frustrated that his blood glucose was still high.  Reviewed labs HgA1C 12.8 up from 6.2 at the time of his cardiac event.  Pt still having difficulty with using the meter at home.  Pt receives error message. ? Error message due to such high readings.  Pt has follow up appt with Md in internal med clinic. Did talk to pt about the possibility of having to take insulin even for a short period of time in order to bring blood glucose within an acceptable range.  Pt resistant to this and hopes "the pills" will work. Pt has not had any GI symptoms for side effects of metformin.  Will review pt office note from later today and follow up with pt on Wednesday.  Nutritionist/ Diabetes Educator made aware of new onset of diabetes and increase in A1C.  Will plan to meet with pt on Wednesday morning.  Alanson Alyarlette Carlton RN, BSN

## 2013-03-15 NOTE — Progress Notes (Addendum)
Patient ID: Devon [name], male   DOB: 09-01-1960, 53 y.o.   MRN: [id number] Subjective:   Patient ID: Devon [name] male   DOB: 1960/09/27 53 y.o.   MRN: [id number]  CC:   Follow up visit.    HPI:  [name] is a 53 y.o. man  with past medical history as outlined below, who presents for a followup visit today  1. DM-II: the patient was seen in [facility]ionic on 03/05/13 by Dr. Burtis Junes. Started Metformin taper, currently on 500 mg bid ant will be finally titrated up to 1000 mg BID. Patient is afraid of needle, and does not want to consider insulin injection at this time. Patient had prediabetes before, his A1c seems to have suddenly increased from 6.2 on 10/29/12 to 12.5 on 03/05/13. Patient has been always eating a lot of sweet food, but no significant lifestyle change. Per chart review,  patient was treated with prednisone 40 mg daily for angioedema from 11/12/12 to 11/24/13, which may have partially contributed to his worsening diabetes.   2. CHF: EF 50-55% with grade 1 diastolic dysfunction by 2-D echo on 01/19/13. Patient has been followed up by Dr.Bensihmon, last seen was last month. He was started with isosorbide monitrite 30 mg daily. Patient is taking Lasix 40 mg daily currently. His body with is stable, 252 pounds on 03/05/13-->253 pounds today. He does not have chest pain, shortness of breath, leg edema. He is currently doing Cardiac Rehab.   3. HTN: blood pressure is 126/79 today. No leg edema, or chest pain.   4. CAD: s/p of MI and stent placement (2004 and 10/2012). Currently on aspirin and Brilinta. Patient reports compliance to his medications. Currently he does not have chest pain  ROS:  Denies fever, chills, fatigue, headaches, cough, chest pain, SOB, abdominal pain,diarrhea, constipation, dysuria, urgency, frequency, hematuria, joint pain or leg swelling.   Past Medical History  Diagnosis Date  . Coronary artery disease   . Hypertension   . CHF (congestive heart failure)   .  Diabetes mellitus without complication    Current Outpatient Prescriptions  Medication Sig Dispense Refill  . aspirin EC 81 MG tablet Take 81 mg by mouth daily.      Marland Kitchen atorvastatin (LIPITOR) 20 MG tablet Take 1 tablet (20 mg total) by mouth daily at 6 PM.  30 tablet  6  . carvedilol (COREG) 12.5 MG tablet Take 1 tablet (12.5 mg total) by mouth 2 (two) times daily with a meal.  60 tablet  6  . furosemide (LASIX) 40 MG tablet Take 1 tablet (40 mg total) by mouth daily.  30 tablet  6  . glucose blood test strip Use as instructed  100 each  12  . hydrALAZINE (APRESOLINE) 25 MG tablet Take 1 tablet (25 mg total) by mouth 3 (three) times daily.  90 tablet  6  . isosorbide mononitrate (IMDUR) 30 MG 24 hr tablet Take 1 tablet (30 mg total) by mouth daily.  30 tablet  6  . Lancets (ACCU-CHEK MULTICLIX) lancets Use as instructed  100 each  12  . metFORMIN (GLUCOPHAGE) 500 MG tablet Take 1 tablet (500 mg total) by mouth daily with breakfast. Then take 1 tab 2x day x7 days Then 1 tab breakfast and 2 tab dinner x7 days Then 2 tab with breakfast and dinner x7 days  30 tablet  11  . oxymetazoline (AFRIN) 0.05 % nasal spray Place 2 sprays in left nostril once as needed, for nosebleed.  30 mL  0  . sildenafil (VIAGRA) 100 MG tablet Take 1 tablet (100 mg total) by mouth daily as needed for erectile dysfunction.  8 tablet  0  . spironolactone (ALDACTONE) 25 MG tablet Take 1 tablet (25 mg total) by mouth daily.  30 tablet  6  . Ticagrelor (BRILINTA) 90 MG TABS tablet Take 1 tablet (90 mg total) by mouth 2 (two) times daily.  60 tablet  6   No current facility-administered medications for this visit.   Family History  Problem Relation Age of Onset  . Hypertension Father     father, brother, sister  . Diabetes      sister  . Coronary artery disease Sister     sister  . Breast cancer Mother   . Breast cancer Sister    History   Social History  . Marital Status: Single    Spouse Name: N/A    Number of  Children: N/A  . Years of Education: N/A   Social History Main Topics  . Smoking status: Former Smoker -- 0.25 packs/day for 4 years    Types: Cigarettes  . Smokeless tobacco: None     Comment: Quit x 3 months.  . Alcohol Use: No  . Drug Use: No  . Sexual Activity: None   Other Topics Concern  . 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 See HPI.  Objective:  Physical Exam: Filed Vitals:   03/15/13 1322  BP: 126/79  Pulse: 79  Temp: 97 F (36.1 C)  TempSrc: Oral  Height: 5\' 11"  (1.803 m)  Weight: 253 lb 4.8 oz (114.896 kg)  SpO2: 98%   Constitutional: Vital signs reviewed.  Patient is a well-developed and well-nourished, in no acute distress and cooperative with exam.   HEENT:  Head: Normocephalic and atraumatic Mouth: no erythema or exudates, MMM Eyes: PERRL, EOMI, conjunctivae normal, No scleral icterus.  Neck: Supple, Trachea midline normal ROM, No JVD  Cardiovascular: RRR, S1 normal, S2 normal, no MRG, pulses symmetric and intact bilaterally Pulmonary/Chest: CTAB, no wheezes, rales, or rhonchi Abdominal: Soft. Non-tender, non-distended, bowel sounds are normal, no masses, organomegaly, or guarding present.  GU: no CVA tenderness Musculoskeletal: No joint deformities, erythema, or stiffness, ROM full and non-tender Extremities: No leg edema Hematology: no cervical, inginal, or axillary adenopathy.  Neurological: A&O x3, Strength is normal and symmetric bilaterally, cranial nerve II-XII are grossly intact, no focal motor deficit, sensory intact to light touch bilaterally.  Skin: Warm, dry and intact. No rash, cyanosis, or clubbing.  Psychiatric: Normal mood and affect. No suicidal or homicidal ideation.  Assessment & Plan:   Addendum: 03/17/13 Patient's Cre is slightly up from 1.53 on 03/05/13 to 1.60 on 03/15/13. The patient had normal renal function with Cre of  1.03 and GFR>90 on 12/29/12. Will hold his lasix for one week and repeat BMP and re-evaluate patient in one week. Discussed with Dr. Rogelia BogaButcher, who agreed with this plan.   Lorretta HarpXilin Mary Hockey, MD PGY3, Internal Medicine Teaching Service Pager: [phone]    Recent Labs Lab 03/15/13 1437  NA 135  K 4.1  CL 95*  CO2 27  GLUCOSE 267*  BUN 20  CREATININE 1.60*  CALCIUM 9.7

## 2013-03-15 NOTE — Patient Instructions (Signed)
1. You have hospital follow up appointments as follows: 2. Please take all medications as prescribed.  3. If you have worsening of your symptoms or new symptoms arise, please call the clinic [phone] or go to the ER immediately if symptoms are severe.  You have done great job in taking all your medications. I appreciate it very much. Please continue doing that.

## 2013-03-15 NOTE — Assessment & Plan Note (Signed)
Lab Results  Component Value Date   HGBA1C 12.8 03/05/2013   HGBA1C 6.2* 10/29/2012     Assessment: Diabetes control: poor control (HgbA1C >9%) Progress toward A1C goal:  deteriorated Comments:   Plan: Medications:  continue current medications Home glucose monitoring: Frequency:   Timing:   Instruction/counseling given: reminded to bring blood glucose meter & log to each visit Educational resources provided: brochure Self management tools provided: copy of home glucose meter download;home glucose logbook Other plans: Patient had prediabetes before, his A1c seems to have suddenly increased from 6.2 on 10/29/12 to 12.5 on 03/05/13. Patient has been always eating a lot of sweets food, but no significant lifestyle change. Per chart review, patient was treated with prednisone 40 mg daily for angioedema from 11/12/12 to 11/24/13, which may have partially contributed to his worsening diabetes. will continue metformin, and titrate up to the final dose of 1000 mg twice a day.

## 2013-03-15 NOTE — Assessment & Plan Note (Signed)
s/p of MI and stent placement (2004 and 10/2012). Currently on aspirin, Brilinta and isosorbide monitrite 30 mg daily. Currently he does not have chest pain. Will continue current regimen.

## 2013-03-15 NOTE — Assessment & Plan Note (Addendum)
BP Readings from Last 3 Encounters:  03/15/13 126/79  03/05/13 112/75  01/19/13 102/66    Lab Results  Component Value Date   NA 132* 03/05/2013   K 4.8 03/05/2013   CREATININE 1.53* 03/05/2013    Assessment: Blood pressure control: controlled Progress toward BP goal:  at goal Comments:   Plan: Medications:  continue current medications Educational resources provided: brochure Self management tools provided:   Other plans: to continue current regimen: Coreg 12.5 mg twice a day, hydralazine 25 mg 3 times a day, patient is also on Lasix 40 mg daily, spironolactone 25 mg daily.

## 2013-03-16 LAB — GLUCOSE, CAPILLARY: GLUCOSE-CAPILLARY: 476 mg/dL — AB (ref 70–99)

## 2013-03-17 ENCOUNTER — Encounter (HOSPITAL_COMMUNITY): Payer: Self-pay

## 2013-03-17 ENCOUNTER — Ambulatory Visit (HOSPITAL_COMMUNITY)
Admission: RE | Admit: 2013-03-17 | Discharge: 2013-03-17 | Disposition: A | Discharge status: Home / Self Care | Payer: BC Managed Care – PPO | Source: Ambulatory Visit | Attending: Internal Medicine | Admitting: Internal Medicine

## 2013-03-17 ENCOUNTER — Encounter (HOSPITAL_COMMUNITY): Payer: BC Managed Care – PPO

## 2013-03-17 DIAGNOSIS — R911 Solitary pulmonary nodule: Secondary | ICD-10-CM | POA: Insufficient documentation

## 2013-03-17 DIAGNOSIS — R918 Other nonspecific abnormal finding of lung field: Secondary | ICD-10-CM

## 2013-03-17 DIAGNOSIS — I2510 Atherosclerotic heart disease of native coronary artery without angina pectoris: Secondary | ICD-10-CM | POA: Insufficient documentation

## 2013-03-17 DIAGNOSIS — I517 Cardiomegaly: Secondary | ICD-10-CM | POA: Insufficient documentation

## 2013-03-17 NOTE — Progress Notes (Signed)
Case discussed with Dr. Niu soon after the resident saw the patient.  We reviewed the resident's history and exam and pertinent patient test results.  I agree with the assessment, diagnosis, and plan of care documented in the resident's note. 

## 2013-03-19 ENCOUNTER — Encounter (HOSPITAL_COMMUNITY)
Admission: RE | Admit: 2013-03-19 | Discharge: 2013-03-19 | Disposition: A | Discharge status: Home / Self Care | Payer: BC Managed Care – PPO | Source: Ambulatory Visit | Attending: Internal Medicine | Admitting: Internal Medicine

## 2013-03-19 LAB — GLUCOSE, CAPILLARY
GLUCOSE-CAPILLARY: 183 mg/dL — AB (ref 70–99)
GLUCOSE-CAPILLARY: 158 mg/dL — AB (ref 70–99)

## 2013-03-19 NOTE — Progress Notes (Addendum)
Nutrition Note Spoke with pt. Pt recently diagnosed as a Diabetic. Per discussion, the pt ate "a piece of [place] for breakfast." Pre-exercise CBG 183 mg/dL and post-exercise CBG 153 mg/dL. Before meeting with this writer, pt picked up a cappuccino from the cafeteria and added 2 packets of sugar. Pt states he drank a regular fruit-flavored soda last night "but I mixed it with water." Pt food choices and healthier alternatives discussed. Pt reports he has not like sugar substitutes in the past, but he likes Sprite Zero. Pt is interested in learning about how to manage his diabetes and is excited that "I've been losing wt on this diet." Diabetes basics including foods with carbs and eating balanced meals with protein, carbs, and a small amount of healthy fat discussed. Pt expressed understanding of the information reviewed. Continue client-centered nutrition education by RD as part of interdisciplinary care.  Monitor and evaluate progress toward nutrition goal with team. Lab Results  Component Value Date   HGBA1C 12.8 03/05/2013   Nutrition Diagnosis   Food-and nutrition-related knowledge deficit related to lack of exposure to information as related to diagnosis of: ? CVD ? DM (A1c 12.8 )   Obesity related to excessive energy intake as evidenced by a BMI of 33.4  Nutrition RX/ Estimated Daily Nutrition Needs for: wt loss  1850-2350 Kcal, 50-65 gm fat, 12-16 gm sat fat, 1.8-2.4 gm trans-fat, <1500 mg sodium  Nutrition Intervention   Pt's individual nutrition plan reviewed with pt.   Pt to attend the Portion Distortion class   Handouts given for: DM Nutrition Therapy and 5-day menu ideas   Pt to attend the  ? Nutrition I class - met 01/12/13                    ? Nutrition II class    Continue client-centered nutrition education by RD, as part of interdisciplinary care. Goal(s)   Pt to identify and limit food sources of saturated fat, trans fat, and cholesterol   Pt to identify food quantities  necessary to achieve: ? wt loss to a goal wt of 229-247 lb (104.1-112.3 kg) at graduation from cardiac rehab.    Pt able to name foods that affect blood glucose  Monitor and Evaluate progress toward nutrition goal with team. Derek Mound, M.Ed, RD, LDN, CDE 03/19/2013 9:55 AM

## 2013-03-22 ENCOUNTER — Encounter (HOSPITAL_COMMUNITY)
Admission: RE | Admit: 2013-03-22 | Discharge: 2013-03-22 | Disposition: A | Discharge status: Home / Self Care | Payer: BC Managed Care – PPO | Source: Ambulatory Visit | Attending: Internal Medicine | Admitting: Internal Medicine

## 2013-03-22 ENCOUNTER — Encounter: Payer: Self-pay | Admitting: Internal Medicine

## 2013-03-22 LAB — GLUCOSE, CAPILLARY
Glucose-Capillary: 174 mg/dL — ABNORMAL HIGH (ref 70–99)
Glucose-Capillary: 165 mg/dL — ABNORMAL HIGH (ref 70–99)

## 2013-03-23 ENCOUNTER — Ambulatory Visit: Payer: BC Managed Care – PPO | Admitting: Internal Medicine

## 2013-03-24 ENCOUNTER — Ambulatory Visit (INDEPENDENT_AMBULATORY_CARE_PROVIDER_SITE_OTHER): Payer: BC Managed Care – PPO | Admitting: Internal Medicine

## 2013-03-24 ENCOUNTER — Encounter: Payer: Self-pay | Admitting: Internal Medicine

## 2013-03-24 ENCOUNTER — Encounter (HOSPITAL_COMMUNITY)
Admission: RE | Admit: 2013-03-24 | Discharge: 2013-03-24 | Disposition: A | Discharge status: Home / Self Care | Payer: BC Managed Care – PPO | Source: Ambulatory Visit | Attending: Internal Medicine | Admitting: Internal Medicine

## 2013-03-24 VITALS — BP 111/69 | HR 65 | Temp 97.8°F | Ht 71.0 in | Wt 258.4 lb

## 2013-03-24 DIAGNOSIS — E119 Type 2 diabetes mellitus without complications: Secondary | ICD-10-CM

## 2013-03-24 DIAGNOSIS — I5022 Chronic systolic (congestive) heart failure: Secondary | ICD-10-CM

## 2013-03-24 DIAGNOSIS — I2510 Atherosclerotic heart disease of native coronary artery without angina pectoris: Secondary | ICD-10-CM

## 2013-03-24 DIAGNOSIS — I10 Essential (primary) hypertension: Secondary | ICD-10-CM

## 2013-03-24 LAB — GLUCOSE, CAPILLARY
Glucose-Capillary: 180 mg/dL — ABNORMAL HIGH (ref 70–99)
Glucose-Capillary: 151 mg/dL — ABNORMAL HIGH (ref 70–99)

## 2013-03-24 LAB — BASIC METABOLIC PANEL WITH GFR
BUN: 11 mg/dL (ref 6–23)
CALCIUM: 9.4 mg/dL (ref 8.4–10.5)
CO2: 26 meq/L (ref 19–32)
Chloride: 106 mEq/L (ref 96–112)
Creat: 1.11 mg/dL (ref 0.50–1.35)
GFR, Est African American: 87 mL/min
GFR, Est Non African American: 75 mL/min
GLUCOSE: 127 mg/dL — AB (ref 70–99)
Potassium: 4.6 mEq/L (ref 3.5–5.3)
Sodium: 139 mEq/L (ref 135–145)

## 2013-03-24 NOTE — Progress Notes (Signed)
Patient ID: Devon [name], male   DOB: [birth year], 53 y.o.   MRN: [id number] HPI The patient is a 53 y.o. male with a history of hypertension, pulmonary hypertension, DM type 2, previous MI, systolic CHF (EF of 15% 10/30/2012), CAD with drug-eluting stent placed in September 2014, subclinical hypothyroidism who presents for a 1 week f/u visit.  Patient was evaluated in our clinic last week at which time his Cr was found to be elevated (Cr 1.6 from 1.53 on 03/05/13 and Cr 1.00 on 10/2012). Patient was instructed to hold his lasix for one week and return to clinic for BMP recheck. Patient has been doing well since his lasix was held, no increased SOB though he does have some exertional SOB at baseline. Patient noticed mild BLE edema yesterday, which is new. No CP, palpitations. Weight is up today from 253lbs on 03/15/13 to 258lbs today.   Patient complains of having intermitted blurry vision for the last month or so that has worsened in the last week. He has reading glasses at home that he uses. He has not been evaluated by an optometrist for "a while." Patient has a relatively new diagnosis of DM type 2 and was started on metformin in January 2015. The plan was to slowly up titrate the metformin. Patient is currently taking metformin 500mg  qAM and 1000mg  qHS. Patient is planning to increase to metformin 1000mg  BID starting this Sunday. He has not had any diarrhea so far. He checks his CBGs once each morning prior to breakfast, CBGs range 154-180s.   Of note, patient had multiple pulmonary nodules found on chest CT scan 10/2012. Repeat of chest CT on 03/17/13 showed the nodules were stable in size. The radiologist recommended repeating another CT scan in Sept 2015.   ROS: General: no fevers, chills Skin: no rash HEENT: +intermittent blurry vision; no HA, sore throat Pulm: no coughing, wheezing CV: +DOE at baseline; no chest pain, palpitations Abd: no abdominal pain, nausea/vomiting, diarrhea/constipation GU:  no dysuria, hematuria, polyuria Ext: no arthralgias, myalgias Neuro: no weakness, numbness, or tingling  Filed Vitals:   03/24/13 0910  BP: 111/69  Pulse: 65  Temp: 97.8 F (36.6 C)  SpO2 99% r/a  Physical Exam: General: alert, cooperative, and in no apparent distress HEENT: pupils equal round and reactive to light, vision grossly intact Neck: supple Lungs: clear to ascultation bilaterally, normal work of respiration Heart: regular rate and rhythm, no murmurs, gallops, or rubs Abdomen: soft, obese, non-tender, non-distended, normal bowel sounds Extremities: warm b/l; trace pitting edema to BLE, no skin breakdown but skin appears shiny and taught Neurologic: alert & oriented X3, cranial nerves II-XII grossly intact, strength grossly intact, sensation intact to light touch  Current Outpatient Prescriptions on File Prior to Visit  Medication Sig Dispense Refill  . aspirin EC 81 MG tablet Take 81 mg by mouth daily.      Marland Kitchen atorvastatin (LIPITOR) 20 MG tablet Take 1 tablet (20 mg total) by mouth daily at 6 PM.  30 tablet  6  . carvedilol (COREG) 12.5 MG tablet Take 1 tablet (12.5 mg total) by mouth 2 (two) times daily with a meal.  60 tablet  6  . glucose blood test strip Use as instructed  100 each  12  . hydrALAZINE (APRESOLINE) 25 MG tablet Take 1 tablet (25 mg total) by mouth 3 (three) times daily.  90 tablet  6  . isosorbide mononitrate (IMDUR) 30 MG 24 hr tablet Take 1 tablet (30 mg total) by mouth  daily.  30 tablet  6  . Lancets (ACCU-CHEK MULTICLIX) lancets Use as instructed  100 each  12  . metFORMIN (GLUCOPHAGE) 500 MG tablet Take 1 tablet (500 mg total) by mouth daily with breakfast. Then take 1 tab 2x day x7 days Then 1 tab breakfast and 2 tab dinner x7 days Then 2 tab with breakfast and dinner x7 days  30 tablet  11  . oxymetazoline (AFRIN) 0.05 % nasal spray Place 2 sprays in left nostril once as needed, for nosebleed.  30 mL  0  . spironolactone (ALDACTONE) 25 MG tablet  Take 1 tablet (25 mg total) by mouth daily.  30 tablet  6  . Ticagrelor (BRILINTA) 90 MG TABS tablet Take 1 tablet (90 mg total) by mouth 2 (two) times daily.  60 tablet  6  . furosemide (LASIX) 40 MG tablet Take 1 tablet (40 mg total) by mouth daily.  30 tablet  6   No current facility-administered medications on file prior to visit.   *Patient has been holding his lasix for 1 week, but has been compliant with the remainder of his medications.  Assessment/Plan

## 2013-03-24 NOTE — Assessment & Plan Note (Signed)
Patient w/ some leg swelling, though no increased DOE from baseline. Weight is up 5lbs since last week. Lasix has been held for 1 week as creatinine had increased during his last visit.  -recheck BMP today -plan to restart lasix 40mg  daily if Cr has trended down (I will call patient) -follow up in 2 weeks for lab recheck to ensure Cr has not increased again

## 2013-03-24 NOTE — Assessment & Plan Note (Signed)
I believe that patient's blurry vision is due to uncontrolled blood glucose levels and will likely improve with better glucose control. He is complaint with the metformin and his currently taking meformin 500mg  each morning and 100mg  each evening. He plans to start taking metformin 1000mg  BID starting later this week. He will be due for A1c recheck on 05/2013. Fasting morning CBGs are ranging 150s-180s. Continue current plan for now, but I suspect patient may require insulin eventually. This will prove difficult as patient is afraid of using needles.

## 2013-03-24 NOTE — Patient Instructions (Signed)
Thank you for your visit.  Please follow up in 2 weeks for another lab check.  We checked your creatinine (kidney number) today. I will call you with the result. As long as this number is lower than it was last week, we will plan to restart your lasix.  Please go to see your optometrist regarding your eyesight.

## 2013-03-25 ENCOUNTER — Telehealth: Payer: Self-pay | Admitting: Internal Medicine

## 2013-03-25 NOTE — Telephone Encounter (Signed)
I spoke with [name] this morning regarding the results of his BMP. His creatinine downtrended, so I told him he could restart taking his lasix. He has a follow up appointment on 2/20 with me to recheck his creatinine. He plans to follow up at this appointment.

## 2013-03-25 NOTE — Progress Notes (Signed)
Case discussed with Dr. Chikowski at the time of the visit.  We reviewed the resident's history and exam and pertinent patient test results.  I agree with the assessment, diagnosis, and plan of care documented in the resident's note. 

## 2013-03-26 ENCOUNTER — Encounter (HOSPITAL_COMMUNITY)
Admission: RE | Admit: 2013-03-26 | Discharge: 2013-03-26 | Disposition: A | Discharge status: Home / Self Care | Payer: BC Managed Care – PPO | Source: Ambulatory Visit | Attending: Internal Medicine | Admitting: Internal Medicine

## 2013-03-26 LAB — GLUCOSE, CAPILLARY: GLUCOSE-CAPILLARY: 237 mg/dL — AB (ref 70–99)

## 2013-03-29 ENCOUNTER — Encounter (HOSPITAL_COMMUNITY): Payer: BC Managed Care – PPO

## 2013-03-31 ENCOUNTER — Encounter (HOSPITAL_COMMUNITY): Payer: BC Managed Care – PPO

## 2013-04-02 ENCOUNTER — Encounter: Payer: Self-pay | Admitting: Internal Medicine

## 2013-04-02 ENCOUNTER — Encounter (HOSPITAL_COMMUNITY)
Admission: RE | Admit: 2013-04-02 | Discharge: 2013-04-02 | Disposition: A | Discharge status: Home / Self Care | Payer: BC Managed Care – PPO | Source: Ambulatory Visit | Attending: Internal Medicine | Admitting: Internal Medicine

## 2013-04-02 ENCOUNTER — Ambulatory Visit (INDEPENDENT_AMBULATORY_CARE_PROVIDER_SITE_OTHER): Payer: BC Managed Care – PPO | Admitting: Internal Medicine

## 2013-04-02 VITALS — BP 134/82 | HR 80 | Temp 97.7°F | Wt 255.6 lb

## 2013-04-02 DIAGNOSIS — I5022 Chronic systolic (congestive) heart failure: Secondary | ICD-10-CM

## 2013-04-02 DIAGNOSIS — E119 Type 2 diabetes mellitus without complications: Secondary | ICD-10-CM

## 2013-04-02 DIAGNOSIS — I10 Essential (primary) hypertension: Secondary | ICD-10-CM

## 2013-04-02 LAB — BASIC METABOLIC PANEL WITH GFR
BUN: 13 mg/dL (ref 6–23)
CHLORIDE: 97 meq/L (ref 96–112)
CO2: 26 meq/L (ref 19–32)
Calcium: 9.9 mg/dL (ref 8.4–10.5)
Creat: 1.11 mg/dL (ref 0.50–1.35)
GFR, EST NON AFRICAN AMERICAN: 75 mL/min
GFR, Est African American: 87 mL/min
Glucose, Bld: 186 mg/dL — ABNORMAL HIGH (ref 70–99)
Potassium: 4 mEq/L (ref 3.5–5.3)
Sodium: 134 mEq/L — ABNORMAL LOW (ref 135–145)

## 2013-04-02 LAB — GLUCOSE, CAPILLARY
GLUCOSE-CAPILLARY: 195 mg/dL — AB (ref 70–99)
Glucose-Capillary: 194 mg/dL — ABNORMAL HIGH (ref 70–99)

## 2013-04-02 NOTE — Assessment & Plan Note (Signed)
Patient has been on lasix 40mg  daily for 1 week. Weight is down 3lbs since last week and his BLE edema has resolved. We will recheck his Cr again today. If his Cr is relatively stable we will plan to continue the current dose of lasix. If his Cr has increased again to the range it had been a few weeks ago, we will consider decreasing the lasix to 20mg  daily. If this is the case, we will need to see patient back within 2 weeks. If his Cr is stable today, he can follow up with me in 2 months for a routine visit.  -BMP today

## 2013-04-02 NOTE — Assessment & Plan Note (Signed)
As of Sunday of this week patient has been on metformin 1000mg  BID. He has had some diarrhea this week, but this is not bothersome for patient. If patient has worsening diarrhea that is bothersome we can consider switching him to the extended release metformin. He continues to check his CBGs once each morning before breakfast. CBGs have been ranging 160-180 at home, though patient did not bring meter today. Blood glucose appears to be more well controlled since starting the metformin 1000mg  BID. We will need to recheck an A1c in April to evaluate his hypoglycemic regimen. Of note, patient will be seen by an "eye doctor" (no sure if this is ophtho or optometrist) next week. We will need to obtain these records. Patient continues to cut back on sweets and substitute more fruit for his daily danishes that he used to have for breakfast.

## 2013-04-02 NOTE — Patient Instructions (Signed)
Thank you for your visit.  We checked your kidney function again today. I will call you with any abnormal results.  Please follow up with me in April unless I call you for a sooner appointment.

## 2013-04-02 NOTE — Progress Notes (Signed)
Patient ID: Devon [name], Devon [name]   DOB: 1960/06/27, 53 y.o.   MRN: [id number] HPI The patient is a 53 y.o. Devon [name] with a history of hypertension, pulmonary hypertension, DM type 2, previous MI, systolic CHF (EF of 15% 10/30/2012), CAD with drug-eluting stent placed in September 2014, subclinical hypothyroidism who presents for a 1 week f/u visit.  Patient was seen in clinic last week after his lasix had been held x 1 week 2/2 bump in creatinine two weeks ago (Cr 1.03 12/2012-->1.6 03/15/2013--> 1.1 03/24/2013). He is here today for a follow up after restarting the lasix 40mg  daily 1 week ago. Patient has been doing well and his BLE swelling has resolved since I saw him last week. Some baseline DOE, no SOB at rest. Weight is down 3lbs since his last visit on 2/11 when he had not been taking the lasix x 1 week.  For his diabetes, patient is currently taking metformin 1000mg  BID. His metformin has been slowly up titrated over the last few months and as of Sunday this week he increased to 1000mg  BID. He reports having a few episodes of diarrhea, but reports that he does not mind this as he is constipated at times. He c/o intermittent blurry vision during our last visit last week. He has had continued blurred vision and scheduled an eye doctor appt for next week. He is not sure if this is an optometrist or ophthalmologist.   ROS: General: no fevers, chills Skin: no rash HEENT: +blurry vision; no HA, sore throat Pulm: no coughing, wheezing CV: DOE at baseline; no chest pain, palpitations Abd: +intermittent diarrhea this week; no abdominal pain, nausea/vomiting GU: no dysuria, polyuria Ext: no arthralgias, myalgias Neuro: no weakness, numbness, or tingling  Filed Vitals:   04/02/13 0928  BP: 134/82  Pulse: 80  Temp: 97.7 F (36.5 C)  SpO2 98% WT 255lbs (258lbs 03/24/13)  Physical Exam General: alert, cooperative, and in no apparent distress HEENT: NCAT, vision grossly intact, oropharynx clear and  non-erythematous  Neck: supple Lungs: clear to ascultation bilaterally, normal work of respiration, no wheezes, rales, ronchi Heart: regular rate and rhythm, no murmurs, gallops, or rubs Abdomen: soft, non-tender, non-distended, normal bowel sounds Extremities: warm b/l, no pedal edema; lower legs with dry and wrinkled skin b/l Neurologic: alert & oriented X3, cranial nerves II-XII grossly intact, strength grossly intact, sensation intact to light touch  Current Outpatient Prescriptions on File Prior to Visit  Medication Sig Dispense Refill  . aspirin EC 81 MG tablet Take 81 mg by mouth daily.      Marland Kitchen. atorvastatin (LIPITOR) 20 MG tablet Take 1 tablet (20 mg total) by mouth daily at 6 PM.  30 tablet  6  . carvedilol (COREG) 12.5 MG tablet Take 1 tablet (12.5 mg total) by mouth 2 (two) times daily with a meal.  60 tablet  6  . furosemide (LASIX) 40 MG tablet Take 1 tablet (40 mg total) by mouth daily.  30 tablet  6  . glucose blood test strip Use as instructed  100 each  12  . hydrALAZINE (APRESOLINE) 25 MG tablet Take 1 tablet (25 mg total) by mouth 3 (three) times daily.  90 tablet  6  . isosorbide mononitrate (IMDUR) 30 MG 24 hr tablet Take 1 tablet (30 mg total) by mouth daily.  30 tablet  6  . Lancets (ACCU-CHEK MULTICLIX) lancets Use as instructed  100 each  12  . metFORMIN (GLUCOPHAGE) 500 MG tablet Take 1 tablet (500 mg total)  by mouth daily with breakfast. Then take 1 tab 2x day x7 days Then 1 tab breakfast and 2 tab dinner x7 days Then 2 tab with breakfast and dinner x7 days  30 tablet  11  . oxymetazoline (AFRIN) 0.05 % nasal spray Place 2 sprays in left nostril once as needed, for nosebleed.  30 mL  0  . spironolactone (ALDACTONE) 25 MG tablet Take 1 tablet (25 mg total) by mouth daily.  30 tablet  6  . Ticagrelor (BRILINTA) 90 MG TABS tablet Take 1 tablet (90 mg total) by mouth 2 (two) times daily.  60 tablet  6   No current facility-administered medications on file prior to visit.     Assessment/Plan

## 2013-04-05 ENCOUNTER — Encounter (HOSPITAL_COMMUNITY)
Admission: RE | Admit: 2013-04-05 | Discharge: 2013-04-05 | Disposition: A | Discharge status: Home / Self Care | Payer: BC Managed Care – PPO | Source: Ambulatory Visit | Attending: Internal Medicine | Admitting: Internal Medicine

## 2013-04-05 LAB — GLUCOSE, CAPILLARY: GLUCOSE-CAPILLARY: 206 mg/dL — AB (ref 70–99)

## 2013-04-07 ENCOUNTER — Encounter (HOSPITAL_COMMUNITY)
Admission: RE | Admit: 2013-04-07 | Discharge: 2013-04-07 | Disposition: A | Discharge status: Home / Self Care | Payer: BC Managed Care – PPO | Source: Ambulatory Visit | Attending: Internal Medicine | Admitting: Internal Medicine

## 2013-04-07 LAB — GLUCOSE, CAPILLARY: GLUCOSE-CAPILLARY: 218 mg/dL — AB (ref 70–99)

## 2013-04-09 ENCOUNTER — Encounter (HOSPITAL_COMMUNITY): Payer: BC Managed Care – PPO

## 2013-04-12 ENCOUNTER — Encounter (HOSPITAL_COMMUNITY)
Admission: RE | Admit: 2013-04-12 | Discharge: 2013-04-12 | Disposition: A | Discharge status: Home / Self Care | Payer: BC Managed Care – PPO | Source: Ambulatory Visit | Attending: Internal Medicine | Admitting: Internal Medicine

## 2013-04-12 ENCOUNTER — Ambulatory Visit (HOSPITAL_COMMUNITY)
Admission: RE | Admit: 2013-04-12 | Discharge: 2013-04-12 | Disposition: A | Discharge status: Home / Self Care | Payer: BC Managed Care – PPO | Source: Ambulatory Visit | Attending: Internal Medicine | Admitting: Internal Medicine

## 2013-04-12 ENCOUNTER — Encounter (HOSPITAL_COMMUNITY): Payer: Self-pay

## 2013-04-12 VITALS — BP 136/76 | HR 77 | Resp 16 | Wt 255.5 lb

## 2013-04-12 DIAGNOSIS — I509 Heart failure, unspecified: Secondary | ICD-10-CM | POA: Insufficient documentation

## 2013-04-12 DIAGNOSIS — I2510 Atherosclerotic heart disease of native coronary artery without angina pectoris: Secondary | ICD-10-CM

## 2013-04-12 DIAGNOSIS — Z5189 Encounter for other specified aftercare: Secondary | ICD-10-CM | POA: Insufficient documentation

## 2013-04-12 DIAGNOSIS — Z9861 Coronary angioplasty status: Secondary | ICD-10-CM | POA: Insufficient documentation

## 2013-04-12 DIAGNOSIS — I10 Essential (primary) hypertension: Secondary | ICD-10-CM

## 2013-04-12 DIAGNOSIS — I5022 Chronic systolic (congestive) heart failure: Secondary | ICD-10-CM | POA: Insufficient documentation

## 2013-04-12 DIAGNOSIS — I252 Old myocardial infarction: Secondary | ICD-10-CM | POA: Insufficient documentation

## 2013-04-12 LAB — GLUCOSE, CAPILLARY: Glucose-Capillary: 193 mg/dL — ABNORMAL HIGH (ref 70–99)

## 2013-04-12 NOTE — Progress Notes (Signed)
Pt completed 31/36 exercise sessions in cardiac rehab phase II. Pt desires to graduate early due to his plans on returning back to work on 04/14/13 at [employer]. Pt plans to continue his home exercise with walking.  Medication list reconciled. Repeat PHQ2 score 0.  Pt has a positive outlook about his future.  Encouraged pt to become independent with his medication and checking his blood sugar.  Presently his fiancee does the medications in the pill box and checks his blood sugar for him. Pt's short term goal was to loose weight.  Pt gain 2.5 kg from the start of the program on 11/10.  Identifying his diabetes late in the program contributed to his inability to loose weight.  Pt also has not adhered to heart healthy diet as he has been advised to do.  He often boast about the food choice such as wings, hotdogs,pizza and beer. Pt reminded that he must comply with a heart healthy diet due to the diagnosis of diabetes.  Long term goal for pt to engage in jogging.  Pt has not jogged and his overall met level 4.3 would not justify jogging which is a met level of 6-7.  Pt can continue to strive towards these goals with true dedication to exercise and diet. Cherre Huger, BSN

## 2013-04-12 NOTE — Progress Notes (Signed)
Case discussed with Dr. Chikowski at the time of the visit.  We reviewed the resident's history and exam and pertinent patient test results.  I agree with the assessment, diagnosis, and plan of care documented in the resident's note. 

## 2013-04-12 NOTE — Patient Instructions (Addendum)
Follow up with our clinic in 6 months-we will call you to make this appointment closer to time.  Remember to weigh daily, drink less than 2 liters per day, and keep salt intake to a minimum.  Doing great!

## 2013-04-12 NOTE — Progress Notes (Signed)
Patient ID: Reyes Aldaco, male   DOB: 19-Mar-1960, 52 y.o.   MRN: [id number]   PCP: Dr Manson Passey   HPI: Mr. Axtman is a 53 year old man with history of DM2, HTN, ETOH, CAD s/p anterior MI 2004 in [place] with cath and stent x 2 placed at that time. 10/2012 DES mid LCX and embolic obstruction OM-2 partially recanalized with angioplasty.   Admitted to [facility] 10/28/12 through 11/03/12 with acute/chronic systolic heart failure. Had cath as noted below with stent. 10/30/12 ECHO 15%. Started on carvedilol 6.25 mg twice daily, spiro 12.5 mg daily, lasix 40 mg daily, and lisinopril 5 mg bid.  D/C weight 236 pounds.   11/03/12 R & LHC RA 12  RV 51/7  PA 50/25  PCWP 23  LVEDP 21  Fick 4.0  Thermo 4.4  Labs 11/05/12 Creatinine 1.0 K 4.0  Labs 11/11/12 Creatinine 1.0 K 4.8 Pro BNP 1451  Labs 11/24/12 Creatinine 0.82 K 4.0 Pro BNP 1066, digoxin 0.7 Labs 12/29/12 Creatinine 1.03 K 4.0 Cholesterol 161 Triglycerides 153 HDL 36 LDL 94 04/02/13 K 4.0 Creatinine 1.1   ECHO 01/19/13 EF 50-55%  He returns for follow up. Overall he is feeling good. Denies SOB/PND/Orthopnea. Able to walk 2 miles. Compliant with medications. He has not been weighing at home. Completed cardiac rehab today. Following low salt diet and restricting fluids to less than 2L.  He is not on Ace due to angioedema. He is not Imdur due to Viagra use.   FH: Father CVA brother,sister HTN  SH: Former smoker quit 10/2012. Does not drink alcohol  ROS: All systems negative except as listed in HPI, PMH and Problem List.  Past Medical History  Diagnosis Date  . Coronary artery disease   . Hypertension   . CHF (congestive heart failure)   . Diabetes mellitus without complication     Current Outpatient Prescriptions  Medication Sig Dispense Refill  . aspirin EC 81 MG tablet Take 81 mg by mouth daily.      Marland Kitchen atorvastatin (LIPITOR) 20 MG tablet Take 1 tablet (20 mg total) by mouth daily at 6 PM.  30 tablet  6  . carvedilol (COREG) 12.5 MG tablet Take  1 tablet (12.5 mg total) by mouth 2 (two) times daily with a meal.  60 tablet  6  . furosemide (LASIX) 40 MG tablet Take 1 tablet (40 mg total) by mouth daily.  30 tablet  6  . glucose blood test strip Use as instructed  100 each  12  . hydrALAZINE (APRESOLINE) 25 MG tablet Take 1 tablet (25 mg total) by mouth 3 (three) times daily.  90 tablet  6  . isosorbide mononitrate (IMDUR) 30 MG 24 hr tablet Take 1 tablet (30 mg total) by mouth daily.  30 tablet  6  . Lancets (ACCU-CHEK MULTICLIX) lancets Use as instructed  100 each  12  . metFORMIN (GLUCOPHAGE) 500 MG tablet Take 1 tablet (500 mg total) by mouth daily with breakfast. Then take 1 tab 2x day x7 days Then 1 tab breakfast and 2 tab dinner x7 days Then 2 tab with breakfast and dinner x7 days  30 tablet  11  . oxymetazoline (AFRIN) 0.05 % nasal spray Place 2 sprays in left nostril once as needed, for nosebleed.  30 mL  0  . spironolactone (ALDACTONE) 25 MG tablet Take 1 tablet (25 mg total) by mouth daily.  30 tablet  6  . Ticagrelor (BRILINTA) 90 MG TABS tablet Take 1 tablet (  90 mg total) by mouth 2 (two) times daily.  60 tablet  6   No current facility-administered medications for this encounter.    Filed Vitals:   04/12/13 1058  BP: 136/76  Pulse: 77  Resp: 16  Weight: 255 lb 8 oz (115.894 kg)  SpO2: 98%    PHYSICAL EXAM: General:  Well appearing. No resp difficulty HEENT: normal Neck: supple. JVP flat. Carotids 2+ bilaterally; no bruits. No lymphadenopathy or thryomegaly appreciated. Cor: PMI normal. Regular rate & rhythm. No rubs, gallops or murmurs. Lungs: clear Abdomen: obese, soft, nontender, nondistended. No hepatosplenomegaly. No bruits or masses. Good bowel sounds. Extremities: no cyanosis, clubbing, rash, edema Neuro: alert & orientedx3, cranial nerves grossly intact. Moves all 4 extremities w/o difficulty. Affect pleasant.    1. Chronic Systolic Heart Failure:  ECHO 0/98/119/19/14 EF 15%,ICM >  01/2014 EF normalized  50-55%.  NYHA I.  -olume status stable. Will continue lasix 40 mg daily. - Continue  coreg to 12.5 mg BID. Stop digoxin as EF has recovered.  -Continue  hydralazine 25 mg TID. He is not on Imdur because he takes Viagra.  - He is not on ACE-I d/t angioedema. - Reinforced the need and importance of daily weights, a low sodium diet, and fluid restriction (less than 2 L a day). Instructed to call the HF clinic if weight increases more than 3 lbs overnight or 5 lbs in a week.  He may return to work.   2. CAD[place]. S/P PCI/stenting of LCX with embolization to OM-2 in 9/14. Marland Kitchen.  No ischemic symptoms. On brillinta for 1 year. Continue statin, ASA and BB.  Refer to back to Dr Katrinka BlazingSmith   3. Hx of tobacco abuse- Quit 10/2012 and continues to abstain   4. HTN: Stable. Continue current medications.   Follow up in 6 months  CLEGG,AMY NP-C 11:44 AM

## 2013-04-12 NOTE — Addendum Note (Signed)
Encounter addended by: Ave FilterMegan Genevea Miata Culbreth, RN on: 04/12/2013  3:17 PM<BR>     Documentation filed: Patient Instructions Section

## 2013-04-12 NOTE — Addendum Note (Signed)
Encounter addended by: Ave FilterMegan Genevea Bradley, RN on: 04/12/2013  3:14 PM<BR>     Documentation filed: Patient Instructions Section

## 2013-04-12 NOTE — Addendum Note (Signed)
Encounter addended by: Ave FilterMegan Genevea Keshonda Monsour, RN on: 04/12/2013  3:19 PM<BR>     Documentation filed: Patient Instructions Section

## 2013-04-13 ENCOUNTER — Encounter (HOSPITAL_COMMUNITY): Payer: BC Managed Care – PPO

## 2013-04-14 ENCOUNTER — Encounter (HOSPITAL_COMMUNITY): Payer: BC Managed Care – PPO

## 2013-04-16 ENCOUNTER — Encounter (HOSPITAL_COMMUNITY): Payer: BC Managed Care – PPO

## 2013-04-19 ENCOUNTER — Encounter (HOSPITAL_COMMUNITY): Payer: BC Managed Care – PPO

## 2013-04-21 ENCOUNTER — Encounter (HOSPITAL_COMMUNITY): Payer: BC Managed Care – PPO

## 2013-04-23 ENCOUNTER — Encounter (HOSPITAL_COMMUNITY): Payer: BC Managed Care – PPO

## 2013-04-26 ENCOUNTER — Encounter (HOSPITAL_COMMUNITY): Payer: Self-pay | Admitting: Emergency Medicine

## 2013-04-26 DIAGNOSIS — Z888 Allergy status to other drugs, medicaments and biological substances status: Secondary | ICD-10-CM | POA: Insufficient documentation

## 2013-04-26 DIAGNOSIS — I10 Essential (primary) hypertension: Secondary | ICD-10-CM | POA: Insufficient documentation

## 2013-04-26 DIAGNOSIS — I2510 Atherosclerotic heart disease of native coronary artery without angina pectoris: Secondary | ICD-10-CM | POA: Insufficient documentation

## 2013-04-26 DIAGNOSIS — Z7982 Long term (current) use of aspirin: Secondary | ICD-10-CM | POA: Insufficient documentation

## 2013-04-26 DIAGNOSIS — K5732 Diverticulitis of large intestine without perforation or abscess without bleeding: Secondary | ICD-10-CM | POA: Insufficient documentation

## 2013-04-26 DIAGNOSIS — Z87891 Personal history of nicotine dependence: Secondary | ICD-10-CM | POA: Insufficient documentation

## 2013-04-26 DIAGNOSIS — Z79899 Other long term (current) drug therapy: Secondary | ICD-10-CM | POA: Insufficient documentation

## 2013-04-26 DIAGNOSIS — E119 Type 2 diabetes mellitus without complications: Secondary | ICD-10-CM | POA: Insufficient documentation

## 2013-04-26 DIAGNOSIS — I509 Heart failure, unspecified: Secondary | ICD-10-CM | POA: Insufficient documentation

## 2013-04-26 LAB — URINALYSIS, ROUTINE W REFLEX MICROSCOPIC
BILIRUBIN URINE: NEGATIVE
GLUCOSE, UA: NEGATIVE mg/dL
Hgb urine dipstick: NEGATIVE
Ketones, ur: NEGATIVE mg/dL
Leukocytes, UA: NEGATIVE
NITRITE: NEGATIVE
PH: 5 (ref 5.0–8.0)
Protein, ur: NEGATIVE mg/dL
SPECIFIC GRAVITY, URINE: 1.019 (ref 1.005–1.030)
Urobilinogen, UA: 0.2 mg/dL (ref 0.0–1.0)

## 2013-04-26 LAB — CBC WITH DIFFERENTIAL/PLATELET
BASOS ABS: 0 10*3/uL (ref 0.0–0.1)
BASOS PCT: 0 % (ref 0–1)
EOS ABS: 0 10*3/uL (ref 0.0–0.7)
Eosinophils Relative: 0 % (ref 0–5)
HCT: 37.5 % — ABNORMAL LOW (ref 39.0–52.0)
Hemoglobin: 13.4 g/dL (ref 13.0–17.0)
Lymphocytes Relative: 10 % — ABNORMAL LOW (ref 12–46)
Lymphs Abs: 1.4 10*3/uL (ref 0.7–4.0)
MCH: 28.6 pg (ref 26.0–34.0)
MCHC: 35.7 g/dL (ref 30.0–36.0)
MCV: 80.1 fL (ref 78.0–100.0)
Monocytes Absolute: 1.4 10*3/uL — ABNORMAL HIGH (ref 0.1–1.0)
Monocytes Relative: 10 % (ref 3–12)
Neutro Abs: 11.2 10*3/uL — ABNORMAL HIGH (ref 1.7–7.7)
Neutrophils Relative %: 80 % — ABNORMAL HIGH (ref 43–77)
PLATELETS: 268 10*3/uL (ref 150–400)
RBC: 4.68 MIL/uL (ref 4.22–5.81)
RDW: 14 % (ref 11.5–15.5)
WBC: 14 10*3/uL — ABNORMAL HIGH (ref 4.0–10.5)

## 2013-04-26 LAB — COMPREHENSIVE METABOLIC PANEL
ALBUMIN: 4.1 g/dL (ref 3.5–5.2)
ALK PHOS: 88 U/L (ref 39–117)
ALT: 21 U/L (ref 0–53)
AST: 19 U/L (ref 0–37)
BUN: 16 mg/dL (ref 6–23)
CHLORIDE: 96 meq/L (ref 96–112)
CO2: 25 mEq/L (ref 19–32)
Calcium: 9.5 mg/dL (ref 8.4–10.5)
Creatinine, Ser: 1.32 mg/dL (ref 0.50–1.35)
GFR calc Af Amer: 70 mL/min — ABNORMAL LOW (ref >90)
GFR calc non Af Amer: 60 mL/min — ABNORMAL LOW (ref >90)
Glucose, Bld: 144 mg/dL — ABNORMAL HIGH (ref 70–99)
Potassium: 3.8 mEq/L (ref 3.7–5.3)
SODIUM: 138 meq/L (ref 137–147)
TOTAL PROTEIN: 7.6 g/dL (ref 6.0–8.3)
Total Bilirubin: 0.7 mg/dL (ref 0.3–1.2)

## 2013-04-26 LAB — LIPASE, BLOOD: LIPASE: 22 U/L (ref 11–59)

## 2013-04-26 NOTE — ED Notes (Signed)
Pt. reports RLQ pain onset 1 pm today , denies nausea /vomitting or diarrhea . Low grade / no chills or urinary discomfort .

## 2013-04-27 ENCOUNTER — Encounter (HOSPITAL_COMMUNITY): Payer: Self-pay | Admitting: Radiology

## 2013-04-27 ENCOUNTER — Emergency Department (HOSPITAL_COMMUNITY): Payer: BC Managed Care – PPO

## 2013-04-27 ENCOUNTER — Emergency Department (HOSPITAL_COMMUNITY)
Admission: EM | Admit: 2013-04-27 | Discharge: 2013-04-27 | Disposition: A | Discharge status: Home / Self Care | Payer: BC Managed Care – PPO | Attending: Emergency Medicine | Admitting: Emergency Medicine

## 2013-04-27 DIAGNOSIS — K5792 Diverticulitis of intestine, part unspecified, without perforation or abscess without bleeding: Secondary | ICD-10-CM

## 2013-04-27 MED ORDER — OXYCODONE-ACETAMINOPHEN 5-325 MG PO TABS: 2.0000 | ORAL_TABLET | ORAL | Status: DC | PRN

## 2013-04-27 MED ORDER — METRONIDAZOLE 500 MG PO TABS: 500.0000 mg | ORAL_TABLET | Freq: Three times a day (TID) | ORAL | Status: DC

## 2013-04-27 MED ORDER — MORPHINE SULFATE 4 MG/ML IJ SOLN
4.0000 mg | Freq: Once | INTRAMUSCULAR | Status: AC
  Administered 2013-04-27: 4 mg via INTRAVENOUS
  Filled 2013-04-27: qty 1

## 2013-04-27 MED ORDER — CIPROFLOXACIN HCL 500 MG PO TABS: 500.0000 mg | ORAL_TABLET | Freq: Two times a day (BID) | ORAL | Status: DC

## 2013-04-27 MED ORDER — ONDANSETRON HCL 4 MG/2ML IJ SOLN
4.0000 mg | Freq: Once | INTRAMUSCULAR | Status: AC
  Administered 2013-04-27: 4 mg via INTRAVENOUS
  Filled 2013-04-27: qty 2

## 2013-04-27 MED ORDER — SODIUM CHLORIDE 0.9 % IV BOLUS (SEPSIS)
1000.0000 mL | Freq: Once | INTRAVENOUS | Status: AC
  Administered 2013-04-27: 1000 mL via INTRAVENOUS

## 2013-04-27 MED ORDER — IOHEXOL 300 MG/ML  SOLN
20.0000 mL | Freq: Once | INTRAMUSCULAR | Status: AC | PRN
  Administered 2013-04-27: 20 mL via ORAL

## 2013-04-27 MED ORDER — IOHEXOL 300 MG/ML  SOLN
100.0000 mL | Freq: Once | INTRAMUSCULAR | Status: AC | PRN
  Administered 2013-04-27: 100 mL via INTRAVENOUS

## 2013-04-27 MED ORDER — FENTANYL CITRATE 0.05 MG/ML IJ SOLN
50.0000 ug | Freq: Once | INTRAMUSCULAR | Status: AC
  Administered 2013-04-27: 50 ug via INTRAVENOUS
  Filled 2013-04-27: qty 2

## 2013-04-27 NOTE — Discharge Instructions (Signed)
Cipro and Flagyl as prescribed.  Percocet as prescribed as needed for pain.  Return to the ER if you develop worsening pain, bloody stool, high fever with vomiting, or other new and concerning symptoms.   Diverticulitis A diverticulum is a small pouch or sac on the colon. Diverticulosis is the presence of these diverticula on the colon. Diverticulitis is the irritation (inflammation) or infection of diverticula. CAUSES  The colon and its diverticula contain bacteria. If food particles block the tiny opening to a diverticulum, the bacteria inside can grow and cause an increase in pressure. This leads to infection and inflammation and is called diverticulitis. SYMPTOMS   Abdominal pain and tenderness. Usually, the pain is located on the left side of your abdomen. However, it could be located elsewhere.  Fever.  Bloating.  Feeling sick to your stomach (nausea).  Throwing up (vomiting).  Abnormal stools. DIAGNOSIS  Your caregiver will take a history and perform a physical exam. Since many things can cause abdominal pain, other tests may be necessary. Tests may include:  Blood tests.  Urine tests.  X-ray of the abdomen.  CT scan of the abdomen. Sometimes, surgery is needed to determine if diverticulitis or other conditions are causing your symptoms. TREATMENT  Most of the time, you can be treated without surgery. Treatment includes:  Resting the bowels by only having liquids for a few days. As you improve, you will need to eat a low-fiber diet.  Intravenous (IV) fluids if you are losing body fluids (dehydrated).  Antibiotic medicines that treat infections may be given.  Pain and nausea medicine, if needed.  Surgery if the inflamed diverticulum has burst. HOME CARE INSTRUCTIONS   Try a clear liquid diet (broth, tea, or water for as long as directed by your caregiver). You may then gradually begin a low-fiber diet as tolerated.  A low-fiber diet is a diet with less than 10  grams of fiber. Choose the foods below to reduce fiber in the diet:  White breads, cereals, rice, and pasta.  Cooked fruits and vegetables or soft fresh fruits and vegetables without the skin.  Ground or well-cooked tender beef, ham, veal, lamb, pork, or poultry.  Eggs and seafood.  After your diverticulitis symptoms have improved, your caregiver may put you on a high-fiber diet. A high-fiber diet includes 14 grams of fiber for every 1000 calories consumed. For a standard 2000 calorie diet, you would need 28 grams of fiber. Follow these diet guidelines to help you increase the fiber in your diet. It is important to slowly increase the amount fiber in your diet to avoid gas, constipation, and bloating.  Choose whole-grain breads, cereals, pasta, and brown rice.  Choose fresh fruits and vegetables with the skin on. Do not overcook vegetables because the more vegetables are cooked, the more fiber is lost.  Choose more nuts, seeds, legumes, dried peas, beans, and lentils.  Look for food products that have greater than 3 grams of fiber per serving on the Nutrition Facts label.  Take all medicine as directed by your caregiver.  If your caregiver has given you a follow-up appointment, it is very important that you go. Not going could result in lasting (chronic) or permanent injury, pain, and disability. If there is any problem keeping the appointment, call to reschedule. SEEK MEDICAL CARE IF:   Your pain does not improve.  You have a hard time advancing your diet beyond clear liquids.  Your bowel movements do not return to normal. SEEK IMMEDIATE MEDICAL  CARE IF:   Your pain becomes worse.  You have an oral temperature above 102 F (38.9 C), not controlled by medicine.  You have repeated vomiting.  You have bloody or black, tarry stools.  Symptoms that brought you to your caregiver become worse or are not getting better. MAKE SURE YOU:   Understand these instructions.  Will  watch your condition.  Will get help right away if you are not doing well or get worse. Document Released: 11/07/2004 Document Revised: 04/22/2011 Document Reviewed: 03/05/2010 [facility] Patient Information 2014 [place], [place].

## 2013-04-27 NOTE — ED Notes (Signed)
Patient transported to CT 

## 2013-04-27 NOTE — ED Notes (Signed)
Dr. Judd Lienelo at the bedside to update patient on CT results and plan of care. Preparing patient for discharge.

## 2013-04-27 NOTE — ED Notes (Signed)
Dr. Delo at the bedside 

## 2013-04-28 NOTE — ED Provider Notes (Signed)
CSN: [id number]     Arrival date & time 04/26/13  2046 History   First MD Initiated Contact with Patient 04/27/13 0257     Chief Complaint  Patient presents with  . Abdominal Pain     (Consider location/radiation/quality/duration/timing/severity/associated sxs/prior Treatment) HPI Comments: Patient is a [age] year old male with history of diabetes and hypertension. Presents today with complaints of lower abdominal pain that started yesterday. It has been worsening. He denies fevers, chills, vomiting, urinary complaints. He also denies any diarrhea or bloody stool. He has never had anything like this before.  Patient is a 53 y.o. male presenting with abdominal pain. The history is provided by the patient.  Abdominal Pain Pain location:  RUQ and suprapubic Pain quality: cramping   Pain radiates to:  Does not radiate Pain severity:  Moderate Onset quality:  Gradual Duration:  1 day Timing:  Constant Progression:  Worsening Chronicity:  New Relieved by:  Nothing Worsened by:  Nothing tried   Past Medical History  Diagnosis Date  . Coronary artery disease   . Hypertension   . CHF (congestive heart failure)   . Diabetes mellitus without complication    Past Surgical History  Procedure Laterality Date  . Coronary stents     Family History  Problem Relation Age of Onset  . Hypertension Father     father, brother, sister  . Diabetes      sister  . Coronary artery disease Sister     sister  . Breast cancer Mother   . Breast cancer Sister    History  Substance Use Topics  . Smoking status: Former Smoker -- 0.25 packs/day for 4 years    Types: Cigarettes  . Smokeless tobacco: Not on file     Comment: Quit x 3 months.  . Alcohol Use: No     Comment: Not recently.    Review of Systems  Gastrointestinal: Positive for abdominal pain.  All other systems reviewed and are negative.      Allergies  Lisinopril  Home Medications   Current Outpatient Rx  Name  Route   Sig  Dispense  Refill  . aspirin EC 81 MG tablet   Oral   Take 81 mg by mouth daily.         Marland Kitchen. atorvastatin (LIPITOR) 20 MG tablet   Oral   Take 1 tablet (20 mg total) by mouth daily at 6 PM.   30 tablet   6   . carvedilol (COREG) 12.5 MG tablet   Oral   Take 1 tablet (12.5 mg total) by mouth 2 (two) times daily with a meal.   60 tablet   6     Please file dose change. Discontinue previous orde ...   . furosemide (LASIX) 40 MG tablet   Oral   Take 1 tablet (40 mg total) by mouth daily.   30 tablet   6   . hydrALAZINE (APRESOLINE) 25 MG tablet   Oral   Take 1 tablet (25 mg total) by mouth 3 (three) times daily.   90 tablet   6   . isosorbide mononitrate (IMDUR) 30 MG 24 hr tablet   Oral   Take 1 tablet (30 mg total) by mouth daily.   30 tablet   6   . metFORMIN (GLUCOPHAGE) 500 MG tablet   Oral   Take 1 tablet (500 mg total) by mouth daily with breakfast. Then take 1 tab 2x day x7 days Then 1 tab breakfast and 2 tab  dinner x7 days Then 2 tab with breakfast and dinner x7 days   30 tablet   11   . metFORMIN (GLUCOPHAGE) 500 MG tablet   Oral   Take 1,000 mg by mouth 2 (two) times daily with a meal.         . oxymetazoline (AFRIN) 0.05 % nasal spray      Place 2 sprays in left nostril once as needed, for nosebleed.   30 mL   0   . spironolactone (ALDACTONE) 25 MG tablet   Oral   Take 1 tablet (25 mg total) by mouth daily.   30 tablet   6   . Ticagrelor (BRILINTA) 90 MG TABS tablet   Oral   Take 1 tablet (90 mg total) by mouth 2 (two) times daily.   60 tablet   6   . ciprofloxacin (CIPRO) 500 MG tablet   Oral   Take 1 tablet (500 mg total) by mouth 2 (two) times daily. One po bid x 7 days   14 tablet   0   . metroNIDAZOLE (FLAGYL) 500 MG tablet   Oral   Take 1 tablet (500 mg total) by mouth 3 (three) times daily.   21 tablet   0   . oxyCODONE-acetaminophen (PERCOCET) 5-325 MG per tablet   Oral   Take 2 tablets by mouth every 4 (four)  hours as needed.   20 tablet   0    BP 107/56  Pulse 75  Temp(Src) 100.1 F (37.8 C) (Oral)  Resp 18  Ht 5\' 10"  (1.778 m)  Wt 255 lb (115.667 kg)  BMI 36.59 kg/m2  SpO2 98% Physical Exam  Nursing note and vitals reviewed. Constitutional: He is oriented to person, place, and time. He appears well-developed and well-nourished. No distress.  HENT:  Head: Normocephalic and atraumatic.  Mouth/Throat: Oropharynx is clear and moist.  Neck: Normal range of motion. Neck supple.  Cardiovascular: Normal rate, regular rhythm and normal heart sounds.   No murmur heard. Pulmonary/Chest: Effort normal and breath sounds normal. No respiratory distress. He has no wheezes.  Abdominal: Soft. He exhibits no distension. There is tenderness. There is no rebound and no guarding.  There is tenderness to palpation in the suprapubic region and left lower quadrant.  Musculoskeletal: Normal range of motion. He exhibits no edema.  Neurological: He is alert and oriented to person, place, and time.  Skin: Skin is warm and dry. He is not diaphoretic.    ED Course  Procedures (including critical care time) Labs Review Labs Reviewed  CBC WITH DIFFERENTIAL - Abnormal; Notable for the following:    WBC 14.0 (*)    HCT 37.5 (*)    Neutrophils Relative % 80 (*)    Neutro Abs 11.2 (*)    Lymphocytes Relative 10 (*)    Monocytes Absolute 1.4 (*)    All other components within normal limits  COMPREHENSIVE METABOLIC PANEL - Abnormal; Notable for the following:    Glucose, Bld 144 (*)    GFR calc non Af Amer 60 (*)    GFR calc Af Amer 70 (*)    All other components within normal limits  LIPASE, BLOOD  URINALYSIS, ROUTINE W REFLEX MICROSCOPIC   Imaging Review Ct Abdomen Pelvis W Contrast  04/27/2013   CLINICAL DATA:  Right lower quadrant pain beginning today.  EXAM: CT ABDOMEN AND PELVIS WITH CONTRAST  TECHNIQUE: Multidetector CT imaging of the abdomen and pelvis was performed using the standard protocol  following  bolus administration of intravenous contrast.  CONTRAST:  100 mL OMNIPAQUE IOHEXOL 300 MG/ML  SOLN  COMPARISON:  None.  FINDINGS: Dependent atelectasis is seen in the right lung base. There is no pleural or pericardial effusion.  The gallbladder, liver, spleen, adrenal glands, pancreas and biliary tree are unremarkable. Right renal scarring is noted. The left kidney appears normal. The appendix is well visualized and normal in appearance. There is stranding about the lateral aspect of the cecum where a diverticulum is identified. No abscess or perforation is seen. The stomach and small bowel appear normal. Scattered, mild aortic atherosclerosis is noted. No lytic or sclerotic bony lesion is seen.  IMPRESSION: The study is positive for cecal diverticulitis without abscess or perforation. The appendix appears normal. No other acute abnormality is identified.   Electronically Signed   By: Drusilla Kanner M.D.   On: 04/27/2013 04:34     EKG Interpretation None      MDM   Final diagnoses:  Acute diverticulitis    Laboratory studies reveal a leukocytosis and CT scan is diagnostic of acute diverticulitis. He will be treated with Cipro and Flagyl and given a prescription for pain medication. He appears clinically well and tolerating by mouth. He appears appropriate for discharge. He is to return if his symptoms significantly worsen or change.    Geoffery Lyons, MD 04/28/13 [phone]

## 2013-05-30 ENCOUNTER — Other Ambulatory Visit (HOSPITAL_COMMUNITY): Payer: Self-pay | Admitting: Internal Medicine

## 2013-06-07 ENCOUNTER — Ambulatory Visit (INDEPENDENT_AMBULATORY_CARE_PROVIDER_SITE_OTHER): Payer: BC Managed Care – PPO | Admitting: Internal Medicine

## 2013-06-07 ENCOUNTER — Encounter: Payer: Self-pay | Admitting: Internal Medicine

## 2013-06-07 VITALS — BP 108/71 | HR 80 | Temp 97.3°F | Ht 71.0 in | Wt 255.1 lb

## 2013-06-07 DIAGNOSIS — I5022 Chronic systolic (congestive) heart failure: Secondary | ICD-10-CM

## 2013-06-07 DIAGNOSIS — E119 Type 2 diabetes mellitus without complications: Secondary | ICD-10-CM

## 2013-06-07 DIAGNOSIS — I10 Essential (primary) hypertension: Secondary | ICD-10-CM

## 2013-06-07 DIAGNOSIS — I2510 Atherosclerotic heart disease of native coronary artery without angina pectoris: Secondary | ICD-10-CM

## 2013-06-07 DIAGNOSIS — K5732 Diverticulitis of large intestine without perforation or abscess without bleeding: Secondary | ICD-10-CM

## 2013-06-07 LAB — POCT GLYCOSYLATED HEMOGLOBIN (HGB A1C): HEMOGLOBIN A1C: 7.6

## 2013-06-07 LAB — GLUCOSE, CAPILLARY: Glucose-Capillary: 92 mg/dL (ref 70–99)

## 2013-06-07 MED ORDER — METFORMIN HCL 500 MG PO TABS: 1000.0000 mg | ORAL_TABLET | Freq: Two times a day (BID) | ORAL | Status: DC

## 2013-06-07 MED ORDER — SPIRONOLACTONE 25 MG PO TABS: 25.0000 mg | ORAL_TABLET | Freq: Every day | ORAL | Status: DC

## 2013-06-07 MED ORDER — FUROSEMIDE 40 MG PO TABS
40.0000 mg | ORAL_TABLET | Freq: Every day | ORAL | Status: DC
Start: 2013-06-07 — End: 2013-10-21

## 2013-06-07 MED ORDER — CARVEDILOL 12.5 MG PO TABS: 12.5000 mg | ORAL_TABLET | Freq: Two times a day (BID) | ORAL | Status: DC

## 2013-06-07 MED ORDER — GLUCOSE BLOOD VI STRP: ORAL_STRIP | Status: AC

## 2013-06-07 MED ORDER — TICAGRELOR 90 MG PO TABS: 90.0000 mg | ORAL_TABLET | Freq: Two times a day (BID) | ORAL | Status: DC

## 2013-06-07 NOTE — Assessment & Plan Note (Signed)
Lab Results  Component Value Date   HGBA1C 7.6 06/07/2013   HGBA1C 12.8 03/05/2013   HGBA1C 6.2* 10/29/2012     Assessment: Diabetes control:  good Progress toward A1C goal:   much improved Comments:   Plan: Medications:  continue current medications Home glucose monitoring: Frequency:   Timing:   Instruction/counseling given: reminded to bring blood glucose meter & log to each visit, reminded to bring medications to each visit, discussed foot care, discussed the need for weight loss and discussed diet Educational resources provided:   Self management tools provided:   Other plans: no changes to therapy; continue metformin 1000mg  BID; I encouraged patient to continue efforts to modify diet and lose weight

## 2013-06-07 NOTE — Assessment & Plan Note (Signed)
Stable. No ischemic s/s. Continue statin and brilinta.

## 2013-06-07 NOTE — Assessment & Plan Note (Signed)
Diagnosed in March 2015 in ED w/ diverticulitis per CT scan. Pt treated as outpatient w/ cipro/flagyl x 7 days. He has had some intermittent lower abd pain, but no other signs or symptoms of recurrence. Patient denies having any GI complaints today. Will continue to monitor. He does not have a problem with constipation as he has diarrhea 2/2 metformin. Instructed patient to avoid small foods such as peanuts, sunflower seeds. He understands.

## 2013-06-07 NOTE — Assessment & Plan Note (Signed)
Patient is euvolemic w/ symptoms at baseline. Follows in HF clinic and attends cardiac rehab. Continue medication regimen: lasix 40mg  daily, coreg 12.5mg  BID, hydralazine 25mg  TID, imdur 30mg  daily, spironolactone 25mg  daily. No changes to management today. Will need f/u in 3 months.

## 2013-06-07 NOTE — Progress Notes (Signed)
Patient ID: Devon [name], male   DOB: [birth year], 53 y.o.   MRN: [id number] HPI The patient is a 53 y.o. male with a history of hypertension, pulmonary hypertension, DM type 2, previous MI, systolic CHF (EF of 15% 10/30/2012), CAD with drug-eluting stent placed in September 2014, subclinical hypothyroidism who presents for a routine visit.  HTN- Blood pressure well controlled. Does not check BP at home, but today BP is 108/71. Denies symptoms of lightheadedness or dizziness with standing up quickly.  sCHF- Pt with baseline DOE. He is able to climb 2 flights of stairs though he does get SOB. No CP, palpitations, dyspnea at rest, leg swelling. He feels he is at baseline. He takes lasix, coreg, hydralazine, bidil, spironolactone. Does not take ACEi 2/2 prior episode of angioedema.   Diverticulitis- Pt recently seen in ED (04/2013) for an episode of diverticulitis, treated outpatient course of cipro/flagyl x 7 days. Pt notes he has had some intermittent lower abd pain since this episode, mostly when he eats peanuts. He knows he should not eat peanuts. No changes to bowel movements. He has some chronic soft stools 2/2 metformin, this is not new. No constipation, blood in stool, dark or tarry stools. He has no abd pain, N/V today. Denies any urinary complaints.   DM type 2, well controlled- Patient takes metformin 1000mg  BID. Some diarrhea, though he does not mind this. A1c 12.9 in Jan 2015. Repeat A1c today is 7.6. He continues to try avoiding sweets (indulges in a danish once per week--had been daily). Of note, patient has actually gained some weight (10lbs since 11/2012). He does not check CBGs because he does not have test strips.   ROS: General: no fevers, chills Skin: no rash HEENT: no blurry vision, HA Pulm: no coughing CV: no chest pain, palpitations, leg swelling; +DOE Abd: no abdominal pain, nausea/vomiting, constipation; some intermittent diarrhea w/ metformin use GU: no dysuria, hematuria,  polyuria Ext: no arthralgias, myalgias Neuro: no weakness, numbness, or tingling  Filed Vitals:   06/07/13 1559  BP: 108/71  Pulse: 80  Temp: 97.3 F (36.3 C)  spO2 99% r/a   PEX General: alert, cooperative, and in no apparent distress HEENT: pupils equal round and reactive to light, vision grossly intact, oropharynx clear and non-erythematous  Neck: supple Lungs: CTAB Heart: RRR Abdomen: soft, non-tender, non-distended, normal bowel sounds Extremities: warm, no pedal edema Neurologic: alert & oriented X3, cranial nerves II-XII grossly intact, strength grossly intact, sensation intact to light touch  Current Outpatient Prescriptions on File Prior to Visit  Medication Sig Dispense Refill  . aspirin EC 81 MG tablet Take 81 mg by mouth daily.      Marland Kitchen. atorvastatin (LIPITOR) 20 MG tablet TAKE 1 TABLET BY MOUTH DAILY AT 6PM  30 tablet  2  . isosorbide mononitrate (IMDUR) 30 MG 24 hr tablet Take 1 tablet (30 mg total) by mouth daily.  30 tablet  6  . oxymetazoline (AFRIN) 0.05 % nasal spray Place 2 sprays in left nostril once as needed, for nosebleed.  30 mL  0  . ciprofloxacin (CIPRO) 500 MG tablet Take 1 tablet (500 mg total) by mouth 2 (two) times daily. One po bid x 7 days  14 tablet  0  . hydrALAZINE (APRESOLINE) 25 MG tablet Take 1 tablet (25 mg total) by mouth 3 (three) times daily.  90 tablet  6  . metroNIDAZOLE (FLAGYL) 500 MG tablet Take 1 tablet (500 mg total) by mouth 3 (three) times daily.  21  tablet  0  . oxyCODONE-acetaminophen (PERCOCET) 5-325 MG per tablet Take 2 tablets by mouth every 4 (four) hours as needed.  20 tablet  0   No current facility-administered medications on file prior to visit.    Assessment/Plan

## 2013-06-07 NOTE — Assessment & Plan Note (Signed)
BP Readings from Last 3 Encounters:  06/07/13 108/71  04/27/13 107/56  04/12/13 136/76    Lab Results  Component Value Date   NA 138 04/26/2013   K 3.8 04/26/2013   CREATININE 1.32 04/26/2013    Assessment: Blood pressure control:  good Progress toward BP goal:   stable Comments:   Plan: Medications:  continue current medications Educational resources provided:   Self management tools provided:   Other plans: no changes to regimen

## 2013-06-07 NOTE — Patient Instructions (Signed)
Thank you for your visit today.  Please return to clinic in 3 months.  Continue all medications as previously prescribed. I refilled all requested medications today. I prescribed some glucometer test strips. Please check your blood sugar at least once per day.

## 2013-06-08 ENCOUNTER — Other Ambulatory Visit: Payer: Self-pay | Admitting: *Deleted

## 2013-06-08 DIAGNOSIS — E119 Type 2 diabetes mellitus without complications: Secondary | ICD-10-CM

## 2013-06-08 MED ORDER — ONETOUCH ULTRASOFT LANCETS MISC: Status: AC

## 2013-06-08 NOTE — Telephone Encounter (Signed)
Pharmacy called for clarification on What type of strips pt uses. One touch ultra mini

## 2013-06-08 NOTE — Progress Notes (Signed)
INTERNAL MEDICINE TEACHING ATTENDING ADDENDUM - Giorgia Wahler, MD: I reviewed and discussed at the time of visit with the resident Dr. Chikowski, the patient's medical history, physical examination, diagnosis and results of tests and treatment and I agree with the patient's care as documented. 

## 2013-07-08 ENCOUNTER — Ambulatory Visit (INDEPENDENT_AMBULATORY_CARE_PROVIDER_SITE_OTHER): Payer: BC Managed Care – PPO | Admitting: Interventional Cardiology

## 2013-07-08 ENCOUNTER — Encounter: Payer: Self-pay | Admitting: Interventional Cardiology

## 2013-07-08 VITALS — BP 126/72 | HR 74 | Ht 71.0 in | Wt 250.0 lb

## 2013-07-08 DIAGNOSIS — I252 Old myocardial infarction: Secondary | ICD-10-CM

## 2013-07-08 DIAGNOSIS — I5032 Chronic diastolic (congestive) heart failure: Secondary | ICD-10-CM

## 2013-07-08 DIAGNOSIS — I2510 Atherosclerotic heart disease of native coronary artery without angina pectoris: Secondary | ICD-10-CM

## 2013-07-08 DIAGNOSIS — I10 Essential (primary) hypertension: Secondary | ICD-10-CM

## 2013-07-08 NOTE — Patient Instructions (Signed)
Your physician recommends that you continue on your current medications as directed. Please refer to the Current Medication list given to you today.  Your physician wants you to follow-up in: 6-7 months You will receive a reminder letter in the mail two months in advance. If you don't receive a letter, please call our office to schedule the follow-up appointment.

## 2013-07-08 NOTE — Progress Notes (Signed)
Patient ID: Devon [name] Beza, male   DOB: 1960/03/03, 53 y.o.   MRN: [id number]    [address] [place], [place]  [id number] Phone: [phone] Fax:  [phone]  Date:  07/08/2013   ID:  Devon [name] Schuchard, DOB 1960/03/03, MRN [id number]  PCP:  Windell Hummingbirdhikowski, Rachel, MD   ASSESSMENT:  1. Acute systolic heart failure, markedly improved with functional class II status and recovery of LV function from EF of 15% to 55% after 3 months of therapy. His proper current diagnosis is chronic diastolic heart failure 2. Coronary artery disease, stable without recurrent angina 3. Hypertension, controlled  PLAN:  1. Clinical followup in 6-7 months. No change in current medical regimen 2. Reassessment LV function will be performed at the heart failure clinic and medication adjustments made accordingly 3. I condone returning to work. 4. Daily weights and diet restrictions   SUBJECTIVE: Devon [name] Ditmer is a 53 y.o. male no angina. Denies syncope and chest pain. Back to work without limitations. No lower extremity swelling. Does have dry skin with recurring dermatitis in the region of his ankles.   Wt Readings from Last 3 Encounters:  07/08/13 250 lb (113.399 kg)  06/07/13 255 lb 1.6 oz (115.713 kg)  04/26/13 255 lb (115.667 kg)     Past Medical History  Diagnosis Date  . Coronary artery disease   . Hypertension   . CHF (congestive heart failure)   . Diabetes mellitus without complication     Current Outpatient Prescriptions  Medication Sig Dispense Refill  . aspirin EC 81 MG tablet Take 81 mg by mouth daily.      Marland Kitchen. atorvastatin (LIPITOR) 20 MG tablet TAKE 1 TABLET BY MOUTH DAILY AT 6PM  30 tablet  2  . carvedilol (COREG) 12.5 MG tablet Take 1 tablet (12.5 mg total) by mouth 2 (two) times daily with a meal.  60 tablet  6  . furosemide (LASIX) 40 MG tablet Take 1 tablet (40 mg total) by mouth daily.  30 tablet  6  . glucose blood test strip Use as instructed  100 each  12  .  hydrALAZINE (APRESOLINE) 25 MG tablet Take 1 tablet (25 mg total) by mouth 3 (three) times daily.  90 tablet  6  . isosorbide mononitrate (IMDUR) 30 MG 24 hr tablet Take 1 tablet (30 mg total) by mouth daily.  30 tablet  6  . Lancets (ONETOUCH ULTRASOFT) lancets Use as instructed  100 each  12  . metFORMIN (GLUCOPHAGE) 500 MG tablet Take 2 tablets (1,000 mg total) by mouth 2 (two) times daily with a meal.  120 tablet  6  . oxymetazoline (AFRIN) 0.05 % nasal spray Place 1 spray into both nostrils daily as needed for congestion.      Marland Kitchen. spironolactone (ALDACTONE) 25 MG tablet Take 1 tablet (25 mg total) by mouth daily.  30 tablet  6  . ticagrelor (BRILINTA) 90 MG TABS tablet Take 1 tablet (90 mg total) by mouth 2 (two) times daily.  60 tablet  6   No current facility-administered medications for this visit.    Allergies:    Allergies  Allergen Reactions  . Lisinopril Swelling    Social History:  The patient  reports that he has quit smoking. His smoking use included Cigarettes. He has a 1 pack-year smoking history. He does not have any smokeless tobacco history on file. He reports that he does not drink alcohol or use illicit drugs.   ROS:  Please see  the history of present illness.   No blood in the urine or stool   All other systems reviewed and negative.   OBJECTIVE: VS:  BP 126/72  Pulse 74  Ht 5\' 11"  (1.803 m)  Wt 250 lb (113.399 kg)  BMI 34.88 kg/m2 Well nourished, well developed, in no acute distress, obese HEENT: normal Neck: JVD flat. Carotid bruit absent  Cardiac:  normal S1, S2; RRR; no murmur Lungs:  clear to auscultation bilaterally, no wheezing, rhonchi or rales Abd: soft, nontender, no hepatomegaly Ext: Edema absent. Pulses 2+. There is left lateral foot region of dermatitis. Skin: warm and dry Neuro:  CNs 2-12 intact, no focal abnormalities noted  EKG:  Not repeated       Signed, Darci Needle III, MD 07/08/2013 4:30 PM

## 2013-08-11 ENCOUNTER — Encounter: Payer: Self-pay | Admitting: Internal Medicine

## 2013-08-11 ENCOUNTER — Ambulatory Visit (INDEPENDENT_AMBULATORY_CARE_PROVIDER_SITE_OTHER): Payer: BC Managed Care – PPO | Admitting: Internal Medicine

## 2013-08-11 VITALS — BP 120/74 | HR 77 | Temp 97.4°F | Wt 251.3 lb

## 2013-08-11 DIAGNOSIS — I159 Secondary hypertension, unspecified: Secondary | ICD-10-CM

## 2013-08-11 DIAGNOSIS — R918 Other nonspecific abnormal finding of lung field: Secondary | ICD-10-CM

## 2013-08-11 DIAGNOSIS — Z0000 Encounter for general adult medical examination without abnormal findings: Secondary | ICD-10-CM

## 2013-08-11 DIAGNOSIS — I10 Essential (primary) hypertension: Secondary | ICD-10-CM

## 2013-08-11 DIAGNOSIS — E119 Type 2 diabetes mellitus without complications: Secondary | ICD-10-CM

## 2013-08-11 NOTE — Assessment & Plan Note (Signed)
Lab Results  Component Value Date   HGBA1C 7.6 06/07/2013   HGBA1C 12.8 03/05/2013   HGBA1C 6.2* 10/29/2012     Assessment: Diabetes control: good control (HgbA1C at goal) Progress toward A1C goal:  at goal Comments: Will recheck A1c in September with fasting lipid panel due to good control  Plan: Medications:  continue current medications Home glucose monitoring: Frequency:  (two times a week) Timing:   Instruction/counseling given: reminded to bring blood glucose meter & log to each visit and reminded to bring medications to each visit Educational resources provided:   Self management tools provided:   Other plans: none

## 2013-08-11 NOTE — Patient Instructions (Signed)
Please schedule follow-up visit in September along with labwork (A1c & fasting lipid panel) & CT scan.  BP is at goal - keep up the good work. Feel free to exercise but start slow.

## 2013-08-11 NOTE — Progress Notes (Signed)
   Subjective:    Patient ID: Devon [name], male    DOB: 1960/07/16, 53 y.o.   MRN: [id number]  HPI 53 year old male with Type 2 DM, HTN, chronic diastolic heart failure, pulmonary nodules, and history of MI s/p stent who presents today for health maintenance exam.  DM2: Currently taking metformin 500mg  BID. No numbness/tingling, chest pain, changes in vision. Checks glucose twice a week that range from 105-130. Improves diet through limiting sweet intake.  HTN: BP today 120/74. Denies dizziness or lightheadedness.  Chronic diastolic heart failure: No issues with medications at last cardiologists' visit. Denies new shortness of breath or chest pain.    Review of Systems See HPI    Objective:   Physical Exam General: alert, oriented, no acute distress Heart: no abnormal heart sounds, 2+ pulses bilaterally in upper and lower extremities Lungs: clear to auscultation bilaterally, no wheezing      Assessment & Plan:

## 2013-08-11 NOTE — Assessment & Plan Note (Addendum)
-  Refused pneumovax & Tdap -Colonoscopy not due until September due to Brillinta use; will f/u at next visit -Advised exercise with gradual increase in intensity

## 2013-08-11 NOTE — Assessment & Plan Note (Signed)
-  Patient will schedule f/u CT when due for labwork (A1c & lipid panel)

## 2013-08-11 NOTE — Assessment & Plan Note (Signed)
  BP Readings from Last 3 Encounters:  08/11/13 120/74  07/08/13 126/72  06/07/13 108/71    Lab Results  Component Value Date   NA 138 04/26/2013   K 3.8 04/26/2013   CREATININE 1.32 04/26/2013    Assessment: Blood pressure control: controlled Progress toward BP goal:  at goal Comments: continue treatment  Plan: Medications:  continue current medications Educational resources provided:   Self management tools provided:   Other plans: none

## 2013-08-12 NOTE — Progress Notes (Signed)
INTERNAL MEDICINE TEACHING ATTENDING ADDENDUM - Devon LagosNischal Shade Kaley, MD: I personally saw and evaluated [name] in this clinic visit in conjunction with the resident, Dr. Allena [name]. I have discussed patient's plan of care with medical resident during this visit. I have confirmed the physical exam findings and have read and agree with the clinic note including the plan with the following addition: - BP at goal. Continue with current medication - Patient instructed to follow up in September for A1C and for repeat CT chest to monitor pulmonary nodules - patient can resume exercise if tolerates. Told to follow up if he has any chest pain or shortness of breath. He was also instructed to start slow and build up to more strenuous activity

## 2013-08-26 ENCOUNTER — Other Ambulatory Visit (HOSPITAL_COMMUNITY): Payer: Self-pay | Admitting: Internal Medicine

## 2013-08-27 NOTE — Telephone Encounter (Signed)
Pls sch Sept appt

## 2013-10-07 ENCOUNTER — Other Ambulatory Visit: Payer: Self-pay | Admitting: Internal Medicine

## 2013-10-08 NOTE — Telephone Encounter (Signed)
As I saw this patient recently and feel it's necessary for the treatment of their disease, I will refill this prescription for  6 months.

## 2013-10-13 ENCOUNTER — Other Ambulatory Visit: Payer: Self-pay | Admitting: Internal Medicine

## 2013-10-21 ENCOUNTER — Ambulatory Visit (HOSPITAL_COMMUNITY)
Admission: RE | Admit: 2013-10-21 | Discharge: 2013-10-21 | Disposition: A | Discharge status: Home / Self Care | Payer: BC Managed Care – PPO | Source: Ambulatory Visit | Attending: Internal Medicine | Admitting: Internal Medicine

## 2013-10-21 ENCOUNTER — Encounter (HOSPITAL_COMMUNITY): Payer: Self-pay | Admitting: Anesthesiology

## 2013-10-21 ENCOUNTER — Encounter (HOSPITAL_COMMUNITY): Payer: Self-pay

## 2013-10-21 VITALS — BP 120/60 | HR 82 | Wt 247.6 lb

## 2013-10-21 DIAGNOSIS — Z87891 Personal history of nicotine dependence: Secondary | ICD-10-CM | POA: Diagnosis not present

## 2013-10-21 DIAGNOSIS — E119 Type 2 diabetes mellitus without complications: Secondary | ICD-10-CM | POA: Insufficient documentation

## 2013-10-21 DIAGNOSIS — I10 Essential (primary) hypertension: Secondary | ICD-10-CM

## 2013-10-21 DIAGNOSIS — I5022 Chronic systolic (congestive) heart failure: Secondary | ICD-10-CM

## 2013-10-21 DIAGNOSIS — Z9861 Coronary angioplasty status: Secondary | ICD-10-CM | POA: Diagnosis not present

## 2013-10-21 DIAGNOSIS — I2510 Atherosclerotic heart disease of native coronary artery without angina pectoris: Secondary | ICD-10-CM | POA: Insufficient documentation

## 2013-10-21 DIAGNOSIS — I509 Heart failure, unspecified: Secondary | ICD-10-CM | POA: Insufficient documentation

## 2013-10-21 HISTORY — DX: Chronic systolic (congestive) heart failure: I50.22

## 2013-10-21 LAB — BASIC METABOLIC PANEL
Anion gap: 16 — ABNORMAL HIGH (ref 5–15)
BUN: 19 mg/dL (ref 6–23)
CO2: 24 mEq/L (ref 19–32)
CREATININE: 1.31 mg/dL (ref 0.50–1.35)
Calcium: 9.1 mg/dL (ref 8.4–10.5)
Chloride: 101 mEq/L (ref 96–112)
GFR, EST AFRICAN AMERICAN: 70 mL/min — AB (ref >90)
GFR, EST NON AFRICAN AMERICAN: 61 mL/min — AB (ref >90)
GLUCOSE: 122 mg/dL — AB (ref 70–99)
POTASSIUM: 4 meq/L (ref 3.7–5.3)
Sodium: 141 mEq/L (ref 137–147)

## 2013-10-21 MED ORDER — ISOSORBIDE MONONITRATE ER 30 MG PO TB24: 30.0000 mg | ORAL_TABLET | Freq: Every day | ORAL | Status: DC

## 2013-10-21 MED ORDER — FUROSEMIDE 40 MG PO TABS: 40.0000 mg | ORAL_TABLET | ORAL | Status: DC | PRN

## 2013-10-21 NOTE — Patient Instructions (Signed)
Doing great.  Change your lasix to as needed. If your weight starts going up or if you have shortness of breath take a lasix.  Call any issues.  Call Dr. Katrinka Blazing about your Brilinta  F/U 6 months  Do the following things EVERYDAY: 1) Weigh yourself in the morning before breakfast. Write it down and keep it in a log. 2) Take your medicines as prescribed 3) Eat low salt foods-Limit salt (sodium) to 2000 mg per day.  4) Stay as active as you can everyday 5) Limit all fluids for the day to less than 2 liters 6)

## 2013-10-21 NOTE — Progress Notes (Signed)
Patient ID: Devon [name], male   DOB: 1961/01/20, 53 y.o.   MRN: [id number]  [facility]  Primary Cardiologist: Dr. Katrinka Blazing   HPI: [name] is a 53 year old man with history of DM2, HTN, ETOH, CAD s/p anterior MI 2004 in [place] with cath and stent x 2 placed at that time. 10/2012 DES mid LCX and embolic obstruction OM-2 partially recanalized with angioplasty.   Admitted to [facility] 10/28/12 through 11/03/12 with acute/chronic systolic heart failure. Had cath as noted below with stent. 10/30/12 ECHO 15%. Started on carvedilol 6.25 mg twice daily, spiro 12.5 mg daily, lasix 40 mg daily, and lisinopril 5 mg bid.  D/C weight 236 pounds.   11/03/12 R & LHC RA 12  RV 51/7  PA 50/25  PCWP 23  LVEDP 21  Fick 4.0  Thermo 4.4  Labs 11/05/12 Creatinine 1.0 K 4.0  Labs 11/11/12 Creatinine 1.0 K 4.8 Pro BNP 1451  Labs 11/24/12 Creatinine 0.82 K 4.0 Pro BNP 1066, digoxin 0.7 Labs 12/29/12 Creatinine 1.03 K 4.0 Cholesterol 161 Triglycerides 153 HDL 36 LDL 94 04/02/13 K 4.0 Creatinine 1.1   ECHO 01/19/13 EF 50-55%  Follow up for Heart Failure: Doing well. Denies SOB, orthopnea, PND or CP. Works as Arboriculturist at [employer]. Able to go upstairs and go up hills. Taking medications. Started smoking again in June 2015 and is drinking 1-2 beers a day.  Following a low salt diet and drinking less than 2L a day.  He is not on Ace due to angioedema. He is not Imdur due to Viagra use.   FH: Father CVA brother,sister HTN  SH: Former smoker quit 10/2012. Does not drink alcohol  ROS: All systems negative except as listed in HPI, PMH and Problem List.  Past Medical History  Diagnosis Date  . Coronary artery disease     a) MI 2004 in [place] with stent x2 placed b) LHC (11/03/12): in-stent restenosis of LCx with occlusion of OM2, DES Promuis Premier to mid LCx, not able to recanlaize OM2  . Hypertension   . Chronic systolic HF (heart failure)     a) ICM b) Left Ventriculogram (10/2012) EF 15% c)  RHC (10/2012): RA 12, RV 51/7, PA 50/25, PCWP 23, LVEDP 21, Fick CO 4.0 d) ECHO (01/2013) EF 50-55%  . Diabetes mellitus without complication     Current Outpatient Prescriptions  Medication Sig Dispense Refill  . aspirin EC 81 MG tablet Take 81 mg by mouth daily.      Marland Kitchen atorvastatin (LIPITOR) 20 MG tablet TAKE 1 TABLET BY MOUTH DAILY AT 6PM  30 tablet  11  . carvedilol (COREG) 12.5 MG tablet Take 1 tablet (12.5 mg total) by mouth 2 (two) times daily with a meal.  60 tablet  6  . furosemide (LASIX) 40 MG tablet Take 1 tablet (40 mg total) by mouth daily.  30 tablet  6  . glucose blood test strip Use as instructed  100 each  12  . hydrALAZINE (APRESOLINE) 25 MG tablet TAKE 1 TABLET BY MOUTH THREE TIMES DAILY  90 tablet  6  . isosorbide mononitrate (IMDUR) 30 MG 24 hr tablet Take 1 tablet (30 mg total) by mouth daily.  30 tablet  6  . Lancets (ONETOUCH ULTRASOFT) lancets Use as instructed  100 each  12  . metFORMIN (GLUCOPHAGE) 500 MG tablet Take 2 tablets (1,000 mg total) by mouth 2 (two) times daily with a meal.  120 tablet  6  .  oxymetazoline (AFRIN) 0.05 % nasal spray Place 1 spray into both nostrils daily as needed for congestion.      Marland Kitchen spironolactone (ALDACTONE) 25 MG tablet Take 1 tablet (25 mg total) by mouth daily.  30 tablet  6  . ticagrelor (BRILINTA) 90 MG TABS tablet Take 1 tablet (90 mg total) by mouth 2 (two) times daily.  60 tablet  6   No current facility-administered medications for this encounter.    Filed Vitals:   10/21/13 1413  BP: 120/60  Pulse: 82  Weight: 247 lb 9.6 oz (112.311 kg)  SpO2: 99%    PHYSICAL EXAM: General:  Well appearing. No resp difficulty, wife present  HEENT: normal Neck: supple. JVP flat. Carotids 2+ bilaterally; no bruits. No lymphadenopathy or thryomegaly appreciated. Cor: PMI normal. Regular rate & rhythm. No rubs, gallops or murmurs. Lungs: clear Abdomen: obese, soft, nontender, nondistended. No hepatosplenomegaly. No bruits or masses.  Good bowel sounds. Extremities: no cyanosis, clubbing, rash, edema Neuro: alert & orientedx3, cranial nerves grossly intact. Moves all 4 extremities w/o difficulty. Affect pleasant.  1. Chronic Systolic Heart Failure: ICM, EF 50-55% (01/2013)  - NYHA I symptoms and volume status stable. Will change lasix to PRN. - Continue coreg 12.5 mg BID and Spironolactone 25 mg daily. - Will continue hydralazine 25 mg TID. He is not on Imdur with Viagra use.  - He is not on ACE-I d/t angioedema.  - Will repeat ECHO next visit.  - Reinforced the need and importance of daily weights, a low sodium diet, and fluid restriction (less than 2 L a day). Instructed to call the HF clinic if weight increases more than 3 lbs overnight or 5 lbs in a week.  He may return to work.  2. CAD: H/O MI in [place]. S/P PCI/stenting of LCX with embolization to OM-2 in 9/14. Marland Kitchen  No ischemic symptoms. He has now been on Brilinta for 1 year. Will refer to Dr. Katrinka Blazing about stopping, patient will see him in November. Continue statin, ASA and BB.   3. Hx of tobacco abuse- Quit 10/2012, however started smoking again in 09-07-2022 after his sister passed away. Encouraged him to try and quit again.   4. HTN: Stable. Continue current medications.   Follow up in 6 months with ECHO Ulla Potash B NP-C 2:17 PM

## 2013-10-27 ENCOUNTER — Ambulatory Visit (HOSPITAL_COMMUNITY)
Admission: RE | Admit: 2013-10-27 | Discharge: 2013-10-27 | Disposition: A | Discharge status: Home / Self Care | Payer: BC Managed Care – PPO | Source: Ambulatory Visit | Attending: Internal Medicine | Admitting: Internal Medicine

## 2013-10-27 DIAGNOSIS — R918 Other nonspecific abnormal finding of lung field: Secondary | ICD-10-CM | POA: Diagnosis present

## 2013-10-29 ENCOUNTER — Encounter: Payer: Self-pay | Admitting: Internal Medicine

## 2013-10-29 ENCOUNTER — Ambulatory Visit (INDEPENDENT_AMBULATORY_CARE_PROVIDER_SITE_OTHER): Payer: BC Managed Care – PPO | Admitting: Internal Medicine

## 2013-10-29 ENCOUNTER — Other Ambulatory Visit: Payer: Self-pay | Admitting: Internal Medicine

## 2013-10-29 VITALS — BP 125/77 | HR 73 | Temp 98.1°F | Ht 71.0 in | Wt 246.8 lb

## 2013-10-29 DIAGNOSIS — I2510 Atherosclerotic heart disease of native coronary artery without angina pectoris: Secondary | ICD-10-CM

## 2013-10-29 DIAGNOSIS — F17200 Nicotine dependence, unspecified, uncomplicated: Secondary | ICD-10-CM | POA: Diagnosis not present

## 2013-10-29 DIAGNOSIS — I5022 Chronic systolic (congestive) heart failure: Secondary | ICD-10-CM

## 2013-10-29 DIAGNOSIS — R918 Other nonspecific abnormal finding of lung field: Secondary | ICD-10-CM | POA: Diagnosis not present

## 2013-10-29 DIAGNOSIS — I209 Angina pectoris, unspecified: Secondary | ICD-10-CM

## 2013-10-29 DIAGNOSIS — E119 Type 2 diabetes mellitus without complications: Secondary | ICD-10-CM

## 2013-10-29 DIAGNOSIS — I10 Essential (primary) hypertension: Secondary | ICD-10-CM | POA: Diagnosis not present

## 2013-10-29 DIAGNOSIS — I25119 Atherosclerotic heart disease of native coronary artery with unspecified angina pectoris: Secondary | ICD-10-CM

## 2013-10-29 DIAGNOSIS — Z720 Tobacco use: Secondary | ICD-10-CM

## 2013-10-29 LAB — POCT GLYCOSYLATED HEMOGLOBIN (HGB A1C): HEMOGLOBIN A1C: 5.9

## 2013-10-29 LAB — GLUCOSE, CAPILLARY: Glucose-Capillary: 122 mg/dL — ABNORMAL HIGH (ref 70–99)

## 2013-10-29 NOTE — Assessment & Plan Note (Addendum)
CT scan on 9/16 showed stable nodules bilaterally. He will need a follow-up scan in 12 months.

## 2013-10-29 NOTE — Patient Instructions (Addendum)
Please try to continue working to quit smoking, and let us know if you need anything else.  I look forward to seeing you back in 6 months. Thanks!  General Instructions:   Please bring your medicines with you each time you come to clinic.  Medicines may include prescription medications, over-the-counter medications, herbal remedies, eye drops, vitamins, or other pills.   Progress Toward Treatment Goals:  Treatment Goal 08/11/2013  Hemoglobin A1C at goal  Blood pressure at goal    Self Care Goals & Plans:  Self Care Goal 08/11/2013  Manage my medications take my medicines as prescribed; bring my medications to every visit; refill my medications on time  Monitor my health check my feet daily  Eat healthy foods eat foods that are low in salt; eat baked foods instead of fried foods; drink diet soda or water instead of juice or soda  Be physically active find an activity I enjoy  Other -    Home Blood Glucose Monitoring 08/11/2013  Check my blood sugar (No Data)     Care Management & Community Referrals:  Referral 11/13/2012  Referrals made for care management support none needed

## 2013-10-29 NOTE — Progress Notes (Signed)
   Subjective:    Patient ID: Devon [name], male    DOB: November 20, 1960, 53 y.o.   MRN: [id number]  HPI Devon [name] is DM2, HTN, chronic diastolic heart failure, CAD, h/o MI 2004 s/p DES 2014 who presents today for follow-up.   Please see assessment & plan for documentation of her problems.  Review of Systems  Constitutional: Negative for activity change and fatigue.  Respiratory: Negative for shortness of breath.   Cardiovascular: Negative for chest pain and leg swelling.  Gastrointestinal: Negative for abdominal pain.       Objective:   Physical Exam Constitutional: He is oriented to person, place, and time. He appears well-developed and well-nourished. No distress.  HENT:  Head: Normocephalic and atraumatic.  Eyes: Conjunctivae are normal. Pupils are equal, round, and reactive to light.  Cardiovascular: Normal rate, regular rhythm and normal heart sounds.  Exam reveals no gallop and no friction rub.   No murmur heard. Pulmonary/Chest: Effort normal. No respiratory distress. He has no wheezes. He has no rales.  Abdominal: Soft. Bowel sounds are normal. He exhibits no distension. There is no tenderness.  Neurological: He is alert and oriented to person, place, and time. No cranial nerve deficit. Coordination normal.  Skin: Skin is warm and dry. He is not diaphoretic.  Psychiatric: His behavior is normal.          Assessment & Plan:

## 2013-11-01 NOTE — Assessment & Plan Note (Signed)
  Assessment: Progress toward smoking cessation:  smoking more Barriers to progress toward smoking cessation:  stress Comments: He started smoking again (2 packs per week)  in 09-09-2022 following his sister's death but is willing to week. In the past, he stopped altogether.   Plan: Instruction/counseling given:  I counseled patient on the dangers of tobacco use, advised patient to stop smoking, and reviewed strategies to maximize success. Educational resources provided:    Self management tools provided:    Medications to assist with smoking cessation:  None Patient agreed to the following self-care plans for smoking cessation:    Other plans: Feels confident he can quit cold [place] like he did before and assured me he would before his next visit

## 2013-11-01 NOTE — Assessment & Plan Note (Signed)
Dr. Katrinka Blazing manages his [place] use and is due to see him in November to reassess.  Assessment -Stable for now  Plan -Follow-up Brillinta use as he is due for a colonoscopy -Check lipid panel today  ADDENDUM 11/01/2013 3:34 PM:  -Lipid panel was not drawn and will need to be scheduled for next visit.

## 2013-11-01 NOTE — Assessment & Plan Note (Signed)
BP Readings from Last 3 Encounters:  10/29/13 125/77  10/21/13 120/60  08/11/13 120/74    Lab Results  Component Value Date   NA 141 10/21/2013   K 4.0 10/21/2013   CREATININE 1.31 10/21/2013    Assessment: Blood pressure control:   Progress toward BP goal:    Comments: Stable. He is exercising more and watching what he eats which certainly impacts his BP control.  Plan: Medications:  carvedilol 12.5mg  BID, spironolactone , hydralazine  TID Educational resources provided:   Self management tools provided:   Other plans: Continue current regimen.

## 2013-11-01 NOTE — Assessment & Plan Note (Signed)
He follows up with the HF clinic. He is currently taking carvedilol 12.5mg  BID, spironolactone , hydralazine  TID. He is not on an ACEi due to angioedema and was told not that he no longer needed Imdur. Follow-up echo in March 2016. His Lasix is now twice a week as needed, and he understands when to take it. He denies shortness of breath, leg swelling, decrease activity, increased fatigue.  Assessment -Stable  Plan -Continue assessing for symptoms

## 2013-11-01 NOTE — Assessment & Plan Note (Signed)
Lab Results  Component Value Date   HGBA1C 5.9 10/29/2013   HGBA1C 7.6 06/07/2013   HGBA1C 12.8 03/05/2013     Assessment: Diabetes control: good control (HgbA1C at goal) Progress toward A1C goal:  at goal Comments: A1c is markedly improved from last time likely the result of better diet and exercise. His job as a Fish farm manager requires significant exertion which he attributes to his improved health.  Plan: Medications:  metformin  BID Home glucose monitoring: Frequency: once a day Timing: before breakfast Instruction/counseling given: no instruction/counseling  Educational resources provided:   Self management tools provided:   Other plans:  -Reiterated good work -Consider decreasing metformin  BID->QD

## 2013-11-02 NOTE — Progress Notes (Signed)
INTERNAL MEDICINE TEACHING ATTENDING ADDENDUM - Earl Lagos, MD: I personally saw and evaluated Mr. Winegarden in this clinic visit in conjunction with the resident, Dr. Allena Katz. I have discussed patient's plan of care with medical resident during this visit. I have confirmed the physical exam findings and have read and agree with the clinic note including the plan with the following addition: - Pt here for routine follow up. - BP at goal. C/w current meds - A1C improved and at goal. Will consider reducing metformin dose on next visit

## 2013-11-04 ENCOUNTER — Other Ambulatory Visit (INDEPENDENT_AMBULATORY_CARE_PROVIDER_SITE_OTHER): Payer: BC Managed Care – PPO

## 2013-11-04 DIAGNOSIS — I2510 Atherosclerotic heart disease of native coronary artery without angina pectoris: Secondary | ICD-10-CM

## 2013-11-05 LAB — LIPID PANEL
CHOL/HDL RATIO: 3.5 ratio
Cholesterol: 139 mg/dL (ref 0–200)
HDL: 40 mg/dL (ref >39)
LDL CALC: 58 mg/dL (ref 0–99)
Triglycerides: 206 mg/dL — ABNORMAL HIGH (ref <150)
VLDL: 41 mg/dL — AB (ref 0–40)

## 2013-11-10 NOTE — Addendum Note (Signed)
Addended by: Bufford SpikesFULCHER, Niaya Hickok N on: 11/10/2013 02:57 PM   Modules accepted: Orders

## 2013-11-15 ENCOUNTER — Encounter: Payer: Self-pay | Admitting: *Deleted

## 2013-11-21 ENCOUNTER — Encounter (HOSPITAL_COMMUNITY): Payer: Self-pay | Admitting: Emergency Medicine

## 2013-11-21 ENCOUNTER — Emergency Department (HOSPITAL_COMMUNITY)
Admission: EM | Admit: 2013-11-21 | Discharge: 2013-11-21 | Disposition: A | Discharge status: Home / Self Care | Payer: BC Managed Care – PPO | Attending: Emergency Medicine | Admitting: Emergency Medicine

## 2013-11-21 DIAGNOSIS — I5022 Chronic systolic (congestive) heart failure: Secondary | ICD-10-CM | POA: Diagnosis not present

## 2013-11-21 DIAGNOSIS — I10 Essential (primary) hypertension: Secondary | ICD-10-CM | POA: Diagnosis not present

## 2013-11-21 DIAGNOSIS — H109 Unspecified conjunctivitis: Secondary | ICD-10-CM | POA: Insufficient documentation

## 2013-11-21 DIAGNOSIS — Z7982 Long term (current) use of aspirin: Secondary | ICD-10-CM | POA: Insufficient documentation

## 2013-11-21 DIAGNOSIS — Z79899 Other long term (current) drug therapy: Secondary | ICD-10-CM | POA: Diagnosis not present

## 2013-11-21 DIAGNOSIS — Z720 Tobacco use: Secondary | ICD-10-CM | POA: Diagnosis not present

## 2013-11-21 DIAGNOSIS — E119 Type 2 diabetes mellitus without complications: Secondary | ICD-10-CM | POA: Insufficient documentation

## 2013-11-21 DIAGNOSIS — Z9861 Coronary angioplasty status: Secondary | ICD-10-CM | POA: Diagnosis not present

## 2013-11-21 DIAGNOSIS — I2510 Atherosclerotic heart disease of native coronary artery without angina pectoris: Secondary | ICD-10-CM | POA: Diagnosis not present

## 2013-11-21 MED ORDER — SULFACETAMIDE SODIUM 10 % OP SOLN: 2.0000 [drp] | Freq: Four times a day (QID) | OPHTHALMIC | Status: AC

## 2013-11-21 NOTE — ED Notes (Signed)
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

## 2013-11-21 NOTE — ED Provider Notes (Signed)
CSN: [id number]     Arrival date & time 11/21/13  1651 History  This chart was scribed for non-physician practitioner, Ebbie Ridgehris Quanna Wittke, PA-C working with Devon RazorStephen Kohut, MD by Greggory StallionKayla Andersen, ED scribe. This patient was seen in room TR04C/TR04C and the patient's care was started at 5:19 PM.   Chief Complaint  Patient presents with  . Conjunctivitis   The history is provided by the patient. No language interpreter was used.   HPI Comments: Devon [name] is a 53 y.o. male who presents to the Emergency Department complaining of bilateral eye redness, itching and burning pain that started 3 days ago. He states his eyes are matted shut in the morning with white crusting. Pt was on a golf cart the day before the symptoms started. Reports rhinorrhea and congestion a few days before his other symptoms started. 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 Denies sore throat. Pt does not have an ophthalmologist.   Past Medical History  Diagnosis Date  . Coronary artery disease     a) [place] x[place] b) LHC (11/03/12): in-stent restenosis of LCx with occlusion of OM2, DES Promuis Premier to mid LCx, not able to recanlaize OM2  . Hypertension   . Chronic systolic HF (heart failure)     a) ICM b) Left Ventriculogram (10/2012) EF 15% c) RHC (10/2012): RA 12, RV 51/7, PA 50/25, PCWP 23, LVEDP 21, Fick CO 4.0 d) ECHO (01/2013) EF 50-55%  . Diabetes mellitus without complication    Past Surgical History  Procedure Laterality Date  . Coronary stents     Family History  Problem Relation Age of Onset  . Hypertension Father     father, brother, sister  . Diabetes      sister  . Coronary artery disease Sister     sister  . Breast cancer Mother   . Breast cancer Sister    History  Substance Use Topics  . Smoking status: Current Every Day Smoker -- 0.25 packs/day for 4 years    Types: Cigarettes  . Smokeless tobacco: Not on file     Comment: Quit  September 2014 and now smoking again 07/2013  . Alcohol Use: Yes     Comment: drinking about 2 beers a day.    Review of Systems   All other systems negative except as documented in the HPI. All pertinent positives and negatives as reviewed in the HPI.  Allergies  Lisinopril  Home Medications   Prior to Admission medications   Medication Sig Start Date End Date Taking? Authorizing Provider  aspirin EC 81 MG tablet Take 81 mg by mouth daily.    Historical Provider, MD  atorvastatin (LIPITOR) 20 MG tablet TAKE 1 TABLET BY MOUTH DAILY AT 6PM    Burns SpainElizabeth A Butcher, MD  carvedilol (COREG) 12.5 MG tablet Take 1 tablet (12.5 mg total) by mouth 2 (two) times daily with a meal. 06/07/13   Windell Hummingbirdachel Chikowski, MD  furosemide (LASIX) 40 MG tablet Take 1 tablet (40 mg total) by mouth as needed. 10/21/13   Aundria RudAli B Cosgrove, NP  glucose blood test strip Use as instructed 06/07/13   Windell Hummingbirdachel Chikowski, MD  hydrALAZINE (APRESOLINE) 25 MG tablet TAKE 1 TABLET BY MOUTH THREE TIMES DAILY 10/08/13   Heywood Ilesushil Patel, MD  isosorbide mononitrate (IMDUR) 30 MG 24 hr tablet Take 1 tablet (30 mg total) by mouth daily. 10/21/13 10/21/14  Aundria RudAli B Cosgrove,  NP  Lancets (ONETOUCH ULTRASOFT) lancets Use as instructed 06/08/13   Windell Hummingbirdachel Chikowski, MD  metFORMIN (GLUCOPHAGE) 500 MG tablet Take 2 tablets (1,000 mg total) by mouth 2 (two) times daily with a meal. 06/07/13   Windell Hummingbirdachel Chikowski, MD  oxymetazoline (AFRIN) 0.05 % nasal spray Place 1 spray into both nostrils daily as needed for congestion.    Historical Provider, MD  spironolactone (ALDACTONE) 25 MG tablet Take 1 tablet (25 mg total) by mouth daily. 06/07/13   Windell Hummingbirdachel Chikowski, MD  sulfacetamide (BLEPH-10) 10 % ophthalmic solution Place 2 drops into both eyes 4 (four) times daily. 11/21/13 11/28/13  Jamesetta Orleanshristopher W Sonna Lipsky, PA-C  ticagrelor (BRILINTA) 90 MG TABS tablet Take 1 tablet (90 mg total) by mouth 2 (two) times daily. 06/07/13   Windell Hummingbirdachel Chikowski, MD   BP 116/71  Pulse 80   Temp(Src) 98.6 F (37 C) (Oral)  Resp 18  Ht 5\' 10"  (1.778 m)  Wt 242 lb 4.8 oz (109.907 kg)  BMI 34.77 kg/m2  SpO2 97%  Physical Exam  Nursing note and vitals reviewed. Constitutional: He is oriented to person, place, and time. He appears well-developed and well-nourished. No distress.  HENT:  Head: Normocephalic and atraumatic.  Eyes: EOM are normal. Right eye exhibits no discharge. Left eye exhibits no discharge. Right conjunctiva is injected. Left conjunctiva is injected.  Neck: Neck supple. No tracheal deviation present.  Cardiovascular: Normal rate.   Pulmonary/Chest: Effort normal. No respiratory distress.  Musculoskeletal: Normal range of motion.  Neurological: He is alert and oriented to person, place, and time.  Skin: Skin is warm and dry.  Psychiatric: He has a normal mood and affect. His behavior is normal.    ED Course  Procedures (including critical care time)  DIAGNOSTIC STUDIES: Oxygen Saturation is 97% on RA, normal by my interpretation.    COORDINATION OF CARE: 5:21 PM-Discussed treatment plan which includes eyedrops with pt at bedside and pt agreed to plan.  Patient be referred to ophthalmology to return here as needed.  Most likely an allergic type response.  Will treat for conjunctivitis  I personally performed the services described in this documentation, which was scribed in my presence. The recorded information has been reviewed and is accurate.  Carlyle Dollyhristopher W Santoria Chason, PA-C 11/22/13 [phone]

## 2013-11-21 NOTE — Discharge Instructions (Signed)
Return here as needed.  Follow-up with the ophthalmologist provided. °

## 2013-11-21 NOTE — ED Notes (Signed)
Declined W/C at D/C and was escorted to lobby by RN. 

## 2013-11-24 NOTE — ED Provider Notes (Signed)
Medical screening examination/treatment/procedure(s) were performed by non-physician practitioner and as supervising physician I was immediately available for consultation/collaboration.   EKG Interpretation None       Ronnica Dreese, MD 11/24/13 1855 

## 2013-11-28 NOTE — Assessment & Plan Note (Signed)
ADDENDUM 11/28/2013 6:19 PM:  Lipid Panel     Component Value Date/Time   CHOL 139 11/04/2013 0852   TRIG 206* 11/04/2013 0852   HDL 40 11/04/2013 0852   CHOLHDL 3.5 11/04/2013 0852   VLDL 41* 11/04/2013 0852   LDLCALC 58 11/04/2013 0852   -LDL at goal

## 2014-01-04 DIAGNOSIS — E785 Hyperlipidemia, unspecified: Secondary | ICD-10-CM | POA: Insufficient documentation

## 2014-01-05 ENCOUNTER — Encounter: Payer: Self-pay | Admitting: Interventional Cardiology

## 2014-01-05 ENCOUNTER — Ambulatory Visit (INDEPENDENT_AMBULATORY_CARE_PROVIDER_SITE_OTHER): Payer: BC Managed Care – PPO | Admitting: Interventional Cardiology

## 2014-01-05 VITALS — BP 134/96 | HR 83 | Ht 70.0 in | Wt 243.0 lb

## 2014-01-05 DIAGNOSIS — Z720 Tobacco use: Secondary | ICD-10-CM

## 2014-01-05 DIAGNOSIS — I2510 Atherosclerotic heart disease of native coronary artery without angina pectoris: Secondary | ICD-10-CM

## 2014-01-05 DIAGNOSIS — E785 Hyperlipidemia, unspecified: Secondary | ICD-10-CM

## 2014-01-05 DIAGNOSIS — I5032 Chronic diastolic (congestive) heart failure: Secondary | ICD-10-CM

## 2014-01-05 DIAGNOSIS — I10 Essential (primary) hypertension: Secondary | ICD-10-CM

## 2014-01-05 NOTE — Patient Instructions (Signed)
Your physician wants you to follow-up in:  12 months.  You will receive a reminder letter in the mail two months in advance. If you don't receive a letter, please call our office to schedule the follow-up appointment.   

## 2014-01-05 NOTE — Progress Notes (Signed)
Patient ID: Devon [name], male   DOB: Sep 15, 1960, 53 y.o.   MRN: [id number]    [address], [address], [place]  [address] Phone: [phone] Fax:  [phone]  Date:  01/05/2014   ID:  Devon [name], DOB [birth year], MRN [id number]  PCP:  Charlott Rakes, MD   ASSESSMENT:  1. Chronic diastolic heart failure with improvement in LVEF from 15% to 55% on medical therapy. No heart failure complaints 2. Hypertension, currently on adequate control 3. Hyperlipidemia, on therapy 4. Tobacco use, discontinued 5. 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 He does have a history of prior Cypher stent in the circumflex coronary artery, 2004. Asymptomatic  PLAN:  1. Aerobic exercise for weight  control and cardiovascular fitness 2. Call if any chest discomfort 3. Clinical follow-up in one year 4. No change in current medical regimen 5. Continue be followed in heart failure clinic until released   SUBJECTIVE: Devon [name] is a 53 y.o. male who is doing well. He has severe systolic dysfunction when initially met. Most recent echo from the stem at 2014 reveals an EF of 50-55%. He denies orthopnea. He is back working full-time. There is no peripheral edema. He denies chest pain. He is on his medications as prescribed.   Wt Readings from Last 3 Encounters:  01/05/14 243 lb (110.224 kg)  11/21/13 242 lb 4.8 oz (109.907 kg)  10/29/13 246 lb 12.8 oz (111.948 kg)     Past Medical History  Diagnosis Date  . Coronary artery disease     a) MI 2004 in [place] with stent x2 placed b) LHC (11/03/12): in-stent restenosis of LCx with occlusion of OM2, DES Promuis Premier to mid LCx, not able to recanlaize OM2  . Hypertension   . Chronic systolic HF (heart failure)     a) ICM b) Left Ventriculogram (10/2012) EF 15% c) RHC (10/2012): RA 12, RV 51/7, PA 50/25, PCWP 23, LVEDP 21, Fick CO 4.0 d) ECHO (01/2013) EF 50-55%  . Diabetes mellitus without  complication     Current Outpatient Prescriptions  Medication Sig Dispense Refill  . aspirin EC 81 MG tablet Take 81 mg by mouth daily.    Marland Kitchen atorvastatin (LIPITOR) 20 MG tablet TAKE 1 TABLET BY MOUTH DAILY AT 6PM 30 tablet 11  . carvedilol (COREG) 12.5 MG tablet Take 1 tablet (12.5 mg total) by mouth 2 (two) times daily with a meal. 60 tablet 6  . furosemide (LASIX) 40 MG tablet Take 1 tablet (40 mg total) by mouth as needed. 15 tablet 6  . glucose blood test strip Use as instructed 100 each 12  . hydrALAZINE (APRESOLINE) 25 MG tablet TAKE 1 TABLET BY MOUTH THREE TIMES DAILY 90 tablet 6  . isosorbide mononitrate (IMDUR) 30 MG 24 hr tablet Take 1 tablet (30 mg total) by mouth daily. 30 tablet 6  . Lancets (ONETOUCH ULTRASOFT) lancets Use as instructed 100 each 12  . metFORMIN (GLUCOPHAGE) 500 MG tablet Take 2 tablets (1,000 mg total) by mouth 2 (two) times daily with a meal. 120 tablet 6  . oxymetazoline (AFRIN) 0.05 % nasal spray Place 1 spray into both nostrils daily as needed for congestion.    Marland Kitchen spironolactone (ALDACTONE) 25 MG tablet Take 1 tablet (25 mg total) by mouth daily. 30 tablet 6  . ticagrelor (BRILINTA) 90 MG TABS tablet Take 1 tablet (90 mg total) by mouth 2 (two) times daily. 60 tablet 6   No current  facility-administered medications for this visit.    Allergies:    Allergies  Allergen Reactions  . Lisinopril Swelling    Social History:  The patient  reports that he has been smoking Cigarettes.  He has a 1 pack-year smoking history. He does not have any smokeless tobacco history on file. He reports that he drinks alcohol. He reports that he does not use illicit drugs.   ROS:  Please see the history of present illness.   Denies transient neurological complaints. Does have occasional cramping of the left calf with walking. This is improved.   All other systems reviewed and negative.   OBJECTIVE: VS:  BP 134/96 mmHg  Pulse 83  Ht 5' 10"  (1.778 m)  Wt 243 lb (110.224  kg)  BMI 34.87 kg/m2 Well nourished, well developed, in no acute distress, obese but healthy appearing HEENT: normal Neck: JVD flat. Carotid bruit absent  Cardiac:  normal S1, S2; RRR; no murmur Lungs:  clear to auscultation bilaterally, no wheezing, rhonchi or rales Abd: soft, nontender, no hepatomegaly Ext: Edema absent. Pulses 2+ and symmetric Skin: warm and dry Neuro:  CNs 2-12 intact, no focal abnormalities noted  EKG:  Bolus sinus rhythm, LVH with strain, borderline long QT       Signed, Illene Labrador III, MD 01/05/2014 12:31 PM

## 2014-01-13 ENCOUNTER — Other Ambulatory Visit: Payer: Self-pay | Admitting: *Deleted

## 2014-01-13 DIAGNOSIS — I5022 Chronic systolic (congestive) heart failure: Secondary | ICD-10-CM

## 2014-01-14 MED ORDER — METFORMIN HCL 1000 MG PO TABS: 1000.0000 mg | ORAL_TABLET | Freq: Two times a day (BID) | ORAL | Status: DC

## 2014-01-14 MED ORDER — SPIRONOLACTONE 25 MG PO TABS: 25.0000 mg | ORAL_TABLET | Freq: Every day | ORAL | Status: DC

## 2014-01-14 NOTE — Telephone Encounter (Signed)
As this patient was seen by me on 9/18 and deemed necessary for their treatment as documented in the EHR, I will refill this prescription for metformin & Aldactone.

## 2014-01-20 ENCOUNTER — Encounter (HOSPITAL_COMMUNITY): Payer: Self-pay | Admitting: Interventional Cardiology

## 2014-01-26 ENCOUNTER — Telehealth (HOSPITAL_COMMUNITY): Payer: Self-pay | Admitting: Vascular Surgery

## 2014-01-26 DIAGNOSIS — I25111 Atherosclerotic heart disease of native coronary artery with angina pectoris with documented spasm: Secondary | ICD-10-CM

## 2014-01-26 MED ORDER — TICAGRELOR 90 MG PO TABS: 90.0000 mg | ORAL_TABLET | Freq: Two times a day (BID) | ORAL | Status: DC

## 2014-01-26 NOTE — Telephone Encounter (Signed)
As requested refill returned to pharmacy 

## 2014-01-26 NOTE — Telephone Encounter (Signed)
Pt need refill of Brilinta

## 2014-02-14 ENCOUNTER — Other Ambulatory Visit: Payer: Self-pay | Admitting: *Deleted

## 2014-02-14 DIAGNOSIS — I5022 Chronic systolic (congestive) heart failure: Secondary | ICD-10-CM

## 2014-02-15 MED ORDER — CARVEDILOL 12.5 MG PO TABS: 12.5000 mg | ORAL_TABLET | Freq: Two times a day (BID) | ORAL | Status: DC

## 2014-02-15 NOTE — Telephone Encounter (Signed)
As this patient was seen by Dr. Katrinka BlazingSmith on 11/25 and deemed necessary for their treatment as documented in the EHR, I will refill this prescription for Coreg for one month and request that his cardiologist refill his medications.

## 2014-02-15 NOTE — Telephone Encounter (Signed)
Please refill this medication as listed he can have up to a year's refill

## 2014-02-16 MED ORDER — CARVEDILOL 12.5 MG PO TABS: 12.5000 mg | ORAL_TABLET | Freq: Two times a day (BID) | ORAL | Status: DC

## 2014-02-16 NOTE — Addendum Note (Signed)
Addended by: Jarvis NewcomerPARRIS-GODLEY, Lashan Gluth S on: 02/16/2014 07:23 AM   Modules accepted: Orders

## 2014-03-09 ENCOUNTER — Telehealth: Payer: Self-pay | Admitting: Interventional Cardiology

## 2014-03-09 MED ORDER — CLOPIDOGREL BISULFATE 75 MG PO TABS: 75.0000 mg | ORAL_TABLET | Freq: Every day | ORAL | Status: DC

## 2014-03-09 NOTE — Telephone Encounter (Signed)
New problem    Want to make sure pt is suppose to keep taking Brilinta, so she will get an authorization for it.

## 2014-03-09 NOTE — Telephone Encounter (Signed)
Returned  Call to ReaderErica pharm-D @ the CHF clinic. Adv her that Dr.Smith has reviewed pt chart. He can d/c Brilinta and start Plavix 75mg  qd with no interruption in therapy. An Rx has been sent to pt pharmacy.  Pt aware of switch and above note with verbal understanding.

## 2014-03-14 ENCOUNTER — Telehealth: Payer: Self-pay | Admitting: Interventional Cardiology

## 2014-03-14 NOTE — Telephone Encounter (Signed)
New problem   Pt want to know if Clopidogrel 75mg  is the same as Plavix. Please advise pt.

## 2014-03-14 NOTE — Telephone Encounter (Signed)
PT  AWARE  CLOPIDOGREL IS  GEN FOR PLAVIX ALSO  HAD  QUESTION  RE  CARVEDILOL  PER PT  PHARMACY HAD  NOT GIVEN PT  CARVEDILOL  PT  WILL CHECK TODAY   WITH PHARMACY .Zack Seal/CY

## 2014-04-04 ENCOUNTER — Encounter: Payer: Self-pay | Admitting: Internal Medicine

## 2014-04-04 ENCOUNTER — Ambulatory Visit (INDEPENDENT_AMBULATORY_CARE_PROVIDER_SITE_OTHER): Payer: 59 | Admitting: Internal Medicine

## 2014-04-04 VITALS — BP 139/73 | HR 53 | Temp 97.9°F | Ht 70.0 in | Wt 244.2 lb

## 2014-04-04 DIAGNOSIS — E119 Type 2 diabetes mellitus without complications: Secondary | ICD-10-CM

## 2014-04-04 DIAGNOSIS — F17210 Nicotine dependence, cigarettes, uncomplicated: Secondary | ICD-10-CM

## 2014-04-04 DIAGNOSIS — Z720 Tobacco use: Secondary | ICD-10-CM

## 2014-04-04 DIAGNOSIS — Z0000 Encounter for general adult medical examination without abnormal findings: Secondary | ICD-10-CM

## 2014-04-04 LAB — POCT GLYCOSYLATED HEMOGLOBIN (HGB A1C): Hemoglobin A1C: 5.9

## 2014-04-04 LAB — GLUCOSE, CAPILLARY: GLUCOSE-CAPILLARY: 118 mg/dL — AB (ref 70–99)

## 2014-04-04 MED ORDER — METFORMIN HCL 500 MG PO TABS: 500.0000 mg | ORAL_TABLET | Freq: Two times a day (BID) | ORAL | Status: DC

## 2014-04-04 NOTE — Progress Notes (Signed)
Internal Medicine Clinic Attending  Case discussed with Dr. Patel at the time of the visit.  We reviewed the resident's history and exam and pertinent patient test results.  I agree with the assessment, diagnosis, and plan of care documented in the resident's note.  

## 2014-04-04 NOTE — Assessment & Plan Note (Addendum)
Lab Results  Component Value Date   HGBA1C 5.9 04/04/2014   HGBA1C 5.9 10/29/2013   HGBA1C 7.6 06/07/2013     Assessment: Diabetes control: good control (HgbA1C at goal) Progress toward A1C goal:  at goal Comments: A1c today stable from prior visit. Recently laid off from work. Allows him to have more time to exercise and do walking. Knows that he needs to continue to maintain a healthy diet as it is important for both his diabetes and heart failure. Denies any dizziness, nausea, vomiting, other symptoms of hypoglycemia.  Plan: Medications:  Metformin 1000 mg twice a day Home glucose monitoring: Frequency: no home glucose monitoring Timing: N/A Instruction/counseling given: no instruction/counseling  Educational resources provided: brochure (denies) Self management tools provided:   Other plans: Given that his A1c has been stable, plan to decrease metformin to 500 mg twice a day. He left the office before these instructions could be conveyed to him, so plan is to call him later today and mail him his AVS. -Completed urine microalbumin creatinine ratio, foot exam today ACO quality metrics -Refer to ophthalmology for annual exam

## 2014-04-04 NOTE — Patient Instructions (Signed)
Please try to bring all your medicines next time. This will help us keep you safe from mistakes.  Please complete the stool cards and send them back to complete your annual check for colon cancer.  Also, we will decrease your metformin dose by 1000mg  since your blood sugars have been doing so well.   Keep working on cutting back on those cigarettes.  Please call back in July for your next appointment as schedules for the next year have not been finalized.  We look forward to seeing you soon!

## 2014-04-04 NOTE — Assessment & Plan Note (Signed)
  Assessment: Progress toward smoking cessation:  smoking less Barriers to progress toward smoking cessation:  stress (Triggered by anger) Comments: Reports that he is now smoking 1 pack per week which is down from 2 packs per week when I last saw him -Feels he can quit because it is "in his head" and he knows how to overcome it but just needs to do  Plan: Instruction/counseling given:  I counseled patient on the dangers of tobacco use, advised patient to stop smoking, and reviewed strategies to maximize success. Educational resources provided:  [employer], [employer](1-800-QUIT-NOW) brochure (denies) Self management tools provided:    Medications to assist with smoking cessation:  None Patient agreed to the following self-care plans for smoking cessation:  (Reports that he has tried to quit line in the past and has been of no help to him.)  Other plans:  -Continue to reassess at each visit as he is not interested in trying any pharmacotherapy or any other behavioral strategy.

## 2014-04-04 NOTE — Assessment & Plan Note (Addendum)
-  Continues to refuse vaccines today -Unsure of when Brillinta will be discontinued by his cardiologist so we will plan to give him stool cards so that he can complete his annual colon cancer screening

## 2014-04-04 NOTE — Progress Notes (Signed)
   Subjective:    Patient ID: Devon [name], male    DOB: [birth year], 54 y.o.   MRN: [id number]  HPI [name] is DM2, HTN, chronic diastolic heart failure, CAD, h/o MI 2004 s/p DES 2014 who presents today for follow-up.  Please see asses sment & plan for documentation of each problem.   Review of Systems  Constitutional: Negative for fever.  Respiratory: Negative for shortness of breath.   Cardiovascular: Negative for chest pain and leg swelling.  Gastrointestinal: Negative for nausea, vomiting, abdominal pain, diarrhea and blood in stool.  Genitourinary: Negative for dysuria.  Neurological: Negative for dizziness.       Objective:   Physical Exam Constitutional: He is oriented to person, place, and time. He appears well-developed and well-nourished. Tired appearing. No distress.  HENT:  Head: Normocephalic and atraumatic.  Eyes: Conjunctivae are normal.  Cardiovascular: Normal rate, regular rhythm and normal heart sounds.  Exam reveals no gallop and no friction rub.   No murmur heard. Pulmonary/Chest: Effort normal. No respiratory distress. He has no wheezes. He has no rales.  Extremities: No leg swelling noted.  Neurological: He is alert and oriented to person, place, and time. No cranial nerve deficit. Coordination normal.  Skin: Skin is warm and dry. He is not diaphoretic.  Psychiatric: His behavior is normal.          Assessment & Plan:

## 2014-04-05 LAB — MICROALBUMIN / CREATININE URINE RATIO
CREATININE, URINE: 208.9 mg/dL
MICROALB/CREAT RATIO: 16.8 mg/g (ref 0.0–30.0)
Microalb, Ur: 3.5 mg/dL — ABNORMAL HIGH (ref <2.0)

## 2014-05-26 ENCOUNTER — Other Ambulatory Visit: Payer: Self-pay | Admitting: Internal Medicine

## 2014-05-27 NOTE — Telephone Encounter (Signed)
As this patient was seen by me on 2/22 and deemed necessary for their treatment as documented in the EHR, I will refill this prescription for hydralazine.

## 2014-06-12 ENCOUNTER — Other Ambulatory Visit (HOSPITAL_COMMUNITY): Payer: Self-pay | Admitting: Anesthesiology

## 2014-06-24 ENCOUNTER — Other Ambulatory Visit (HOSPITAL_COMMUNITY): Payer: Self-pay | Admitting: Anesthesiology

## 2014-07-04 ENCOUNTER — Telehealth (HOSPITAL_COMMUNITY): Payer: Self-pay | Admitting: Vascular Surgery

## 2014-07-04 MED ORDER — FUROSEMIDE 40 MG PO TABS: 40.0000 mg | ORAL_TABLET | ORAL | Status: DC | PRN

## 2014-07-04 NOTE — Telephone Encounter (Signed)
Pt called he is retaining fluid he has gained 22 lbs in less than 3 months.. It is time for a follow up. I will wait for you to talk to him before I schedule him .Marland Kitchen. Please advise

## 2014-07-04 NOTE — Telephone Encounter (Signed)
Spoke w/pt, he states wt is up to 264 lb, should be around 242 lb, he states he has been taking Lasix Mon, Wed and Fri and he did take it 3 times today and feels the fluid is really coming off, he wanted to make sure ok to take it for several days advised ok to take bid tomorrow and if not better on Wed AM to call us back, he is aware and agreeable

## 2014-07-21 ENCOUNTER — Other Ambulatory Visit: Payer: Self-pay | Admitting: Internal Medicine

## 2014-07-25 NOTE — Telephone Encounter (Signed)
As this patient was seen by Dr. Katrinka Blazing on 01/05/14 and deemed necessary for their treatment as documented in the EHR, I will refill this prescription for Aldactone.

## 2014-08-11 ENCOUNTER — Other Ambulatory Visit: Payer: Self-pay | Admitting: *Deleted

## 2014-08-11 DIAGNOSIS — E119 Type 2 diabetes mellitus without complications: Secondary | ICD-10-CM

## 2014-08-11 NOTE — Telephone Encounter (Signed)
If you want pt to take BID please increase quantity to 60 a month.

## 2014-08-12 MED ORDER — METFORMIN HCL 500 MG PO TABS: 500.0000 mg | ORAL_TABLET | Freq: Two times a day (BID) | ORAL | Status: AC

## 2014-08-12 NOTE — Telephone Encounter (Signed)
As this patient was seen by me on 04/04/14 and deemed necessary for their treatment as documented in the EHR, I will refill this prescription for metformin and increase his tablets filled to 60 tablets to account for his twice daily dosing.

## 2014-08-19 ENCOUNTER — Telehealth: Payer: Self-pay | Admitting: Professional Counselor

## 2014-08-19 ENCOUNTER — Telehealth (HOSPITAL_COMMUNITY): Payer: Self-pay | Admitting: *Deleted

## 2014-08-19 NOTE — Telephone Encounter (Signed)
Patient called first, then [facility] SW per his request. Patient reports [facility] to get [facility] and is in need of help with medications as well as follow up for his CHF. Patient to send appeal for Medicaid in the coming week. He is given information about [facility] as well as orange card information. Rep [facility] is off work this day and information regarding patient's questions will be left for her to follow up. No CM here this day either.  LCSW gave patient packet of information to complete and will have [facility] patient follow up. Patient agreeable to plan and appreciative.  Appears motivated for treatment of his care and current CHF.  Deretha EmoryHannah Marilyne Haseley LCSW, MSW Clinical Social Work: Emergency Room [phone]

## 2014-08-19 NOTE — Telephone Encounter (Signed)
Pt stopped by clinic today concerned that he has no insurance, no money and is almost out of meds.  Pt states he was denied Medicaid and is in the process of trying to get disability.  [facility] so he can get his meds and will have our CSW [place] call him on Mon to f/u

## 2014-08-23 ENCOUNTER — Telehealth: Payer: Self-pay | Admitting: Licensed Clinical Social Worker

## 2014-08-23 NOTE — Telephone Encounter (Signed)
CSW received request to contact patient to casuist due to no insurance. Patient reports he has a pending SSD application. Patient spoke at length about his disability and currently has no income. CSW provided information on walk [facility]. Patient verbalizes understanding and aware of needed items to take to appointment. CSW asked patient to return call after orange card appointment to reports status. CSW will follow for support and any further issues that may arise. Lasandra BeechJackie Tabb Croghan, LCSW [phone]

## 2014-08-29 ENCOUNTER — Ambulatory Visit: Payer: Self-pay

## 2014-08-29 ENCOUNTER — Encounter (HOSPITAL_COMMUNITY): Payer: Self-pay | Admitting: *Deleted

## 2014-09-12 ENCOUNTER — Ambulatory Visit: Payer: Self-pay

## 2014-09-13 ENCOUNTER — Ambulatory Visit: Payer: Self-pay

## 2014-09-14 ENCOUNTER — Ambulatory Visit (HOSPITAL_COMMUNITY)
Admission: RE | Admit: 2014-09-14 | Discharge: 2014-09-14 | Disposition: A | Discharge status: Home / Self Care | Payer: Self-pay | Source: Ambulatory Visit | Attending: Internal Medicine | Admitting: Internal Medicine

## 2014-09-14 ENCOUNTER — Encounter (HOSPITAL_COMMUNITY): Payer: Self-pay

## 2014-09-14 VITALS — BP 120/80 | HR 75 | Wt 267.8 lb

## 2014-09-14 DIAGNOSIS — I10 Essential (primary) hypertension: Secondary | ICD-10-CM | POA: Insufficient documentation

## 2014-09-14 DIAGNOSIS — I5022 Chronic systolic (congestive) heart failure: Secondary | ICD-10-CM | POA: Insufficient documentation

## 2014-09-14 DIAGNOSIS — I2510 Atherosclerotic heart disease of native coronary artery without angina pectoris: Secondary | ICD-10-CM | POA: Insufficient documentation

## 2014-09-14 DIAGNOSIS — I5032 Chronic diastolic (congestive) heart failure: Secondary | ICD-10-CM

## 2014-09-14 DIAGNOSIS — E119 Type 2 diabetes mellitus without complications: Secondary | ICD-10-CM | POA: Insufficient documentation

## 2014-09-14 DIAGNOSIS — Z720 Tobacco use: Secondary | ICD-10-CM

## 2014-09-14 DIAGNOSIS — Z955 Presence of coronary angioplasty implant and graft: Secondary | ICD-10-CM | POA: Insufficient documentation

## 2014-09-14 DIAGNOSIS — Z8249 Family history of ischemic heart disease and other diseases of the circulatory system: Secondary | ICD-10-CM | POA: Insufficient documentation

## 2014-09-14 DIAGNOSIS — I252 Old myocardial infarction: Secondary | ICD-10-CM | POA: Insufficient documentation

## 2014-09-14 DIAGNOSIS — Z7982 Long term (current) use of aspirin: Secondary | ICD-10-CM | POA: Insufficient documentation

## 2014-09-14 DIAGNOSIS — Z823 Family history of stroke: Secondary | ICD-10-CM | POA: Insufficient documentation

## 2014-09-14 DIAGNOSIS — Z79899 Other long term (current) drug therapy: Secondary | ICD-10-CM | POA: Insufficient documentation

## 2014-09-14 DIAGNOSIS — Z7902 Long term (current) use of antithrombotics/antiplatelets: Secondary | ICD-10-CM | POA: Insufficient documentation

## 2014-09-14 DIAGNOSIS — F17210 Nicotine dependence, cigarettes, uncomplicated: Secondary | ICD-10-CM | POA: Insufficient documentation

## 2014-09-14 LAB — BASIC METABOLIC PANEL
ANION GAP: 7 (ref 5–15)
BUN: 12 mg/dL (ref 6–20)
CHLORIDE: 103 mmol/L (ref 101–111)
CO2: 28 mmol/L (ref 22–32)
CREATININE: 1.36 mg/dL — AB (ref 0.61–1.24)
Calcium: 9.3 mg/dL (ref 8.9–10.3)
GFR calc Af Amer: >60 mL/min (ref >60)
GFR calc non Af Amer: 58 mL/min — ABNORMAL LOW (ref >60)
GLUCOSE: 171 mg/dL — AB (ref 65–99)
Potassium: 4 mmol/L (ref 3.5–5.1)
Sodium: 138 mmol/L (ref 135–145)

## 2014-09-14 NOTE — Progress Notes (Signed)
Patient ID: Devon [name], male   DOB: 02-14-1960, 54 y.o.   MRN: [id number] PCP: [facility]  Primary Cardiologist: Dr. Katrinka Blazing   HPI: Devon [name] is a 54 year old man with history of DM2, HTN, ETOH, CAD s/p anterior MI 2004 in [place] with cath and stent x 2 placed at that time. 10/2012 DES mid LCX and embolic obstruction OM-2 partially recanalized with angioplasty.   Admitted to [facility] 10/28/12 through 11/03/12 with acute/chronic systolic heart failure. Had cath as noted below with stent. 10/30/12 ECHO 15%. Started on carvedilol 6.25 mg twice daily, spiro 12.5 mg daily, lasix 40 mg daily, and lisinopril 5 mg bid.  D/C weight 236 pounds.  Follow up for Heart Failure:  Complaining of fatigue. Mild dyspnea with exertion. Denies PND/Orthopnea/CP. Not exercising. Taking all meds. Smoking 1 pack every 2 days.      He is not on Ace due to angioedema. He is not Imdur due to Viagra use.    11/03/12 R & LHC RA 12  RV 51/7  PA 50/25  PCWP 23  LVEDP 21  Fick 4.0  Thermo 4.4  Labs 11/05/12 Creatinine 1.0 K 4.0  Labs 11/11/12 Creatinine 1.0 K 4.8 Pro BNP 1451  Labs 11/24/12 Creatinine 0.82 K 4.0 Pro BNP 1066, digoxin 0.7 Labs 12/29/12 Creatinine 1.03 K 4.0 Cholesterol 161 Triglycerides 153 HDL 36 LDL 94 04/02/13 K 4.0 Creatinine 1.1   ECHO 01/19/13 EF 50-55%    FH: Father CVA brother,sister HTN  SH: Former smoker quit 10/2012. Does not drink alcohol  ROS: All systems negative except as listed in HPI, PMH and Problem List.  Past Medical History  Diagnosis Date  . Coronary artery disease     a) MI 2004 in [place] with stent x2 placed b) LHC (11/03/12): in-stent restenosis of LCx with occlusion of OM2, DES Promuis Premier to mid LCx, not able to recanlaize OM2  . Hypertension   . Chronic systolic HF (heart failure)     a) ICM b) Left Ventriculogram (10/2012) EF 15% c) RHC (10/2012): RA 12, RV 51/7, PA 50/25, PCWP 23, LVEDP 21, Fick CO 4.0 d) ECHO (01/2013) EF 50-55%  . Diabetes mellitus  without complication     Current Outpatient Prescriptions  Medication Sig Dispense Refill  . aspirin EC 81 MG tablet Take 81 mg by mouth daily.    Marland Kitchen atorvastatin (LIPITOR) 20 MG tablet TAKE 1 TABLET BY MOUTH DAILY AT 6PM 30 tablet 11  . carvedilol (COREG) 12.5 MG tablet Take 1 tablet (12.5 mg total) by mouth 2 (two) times daily with a meal. 60 tablet 11  . clopidogrel (PLAVIX) 75 MG tablet Take 1 tablet (75 mg total) by mouth daily. 30 tablet 10  . furosemide (LASIX) 40 MG tablet Take 1 tablet (40 mg total) by mouth as needed. 30 tablet 3  . glucose blood test strip Use as instructed 100 each 12  . hydrALAZINE (APRESOLINE) 25 MG tablet TAKE 1 TABLET BY MOUTH THREE TIMES DAILY 90 tablet 6  . isosorbide mononitrate (IMDUR) 30 MG 24 hr tablet TAKE 1 TABLET BY MOUTH EVERY DAY 30 tablet 2  . Lancets (ONETOUCH ULTRASOFT) lancets Use as instructed 100 each 12  . metFORMIN (GLUCOPHAGE) 500 MG tablet Take 1 tablet (500 mg total) by mouth 2 (two) times daily with a meal. 60 tablet 6  . oxymetazoline (AFRIN) 0.05 % nasal spray Place 1 spray into both nostrils daily as needed for congestion.    Marland Kitchen spironolactone (ALDACTONE)  25 MG tablet TAKE 1 TABLET BY MOUTH EVERY DAY 90 tablet 4   No current facility-administered medications for this encounter.    Filed Vitals:   09/14/14 0823  BP: 120/80  Pulse: 75  Weight: 267 lb 12.8 oz (121.473 kg)  SpO2: 98%    PHYSICAL EXAM: General:  Well appearing. No resp difficulty, wife present  HEENT: normal Neck: supple. JVP flat. Carotids 2+ bilaterally; no bruits. No lymphadenopathy or thryomegaly appreciated. Cor: PMI normal. Regular rate & rhythm. No rubs, gallops or murmurs. Lungs: clear Abdomen: obese, soft, nontender, nondistended. No hepatosplenomegaly. No bruits or masses. Good bowel sounds. Extremities: no cyanosis, clubbing, rash, edema Neuro: alert & orientedx3, cranial nerves grossly intact. Moves all 4 extremities w/o difficulty. Affect  pleasant.  1. Chronic Systolic Heart Failure: ICM, EF 50-55% (01/2013)  - NYHA II-III. Volume status stable. Continue current dose BB, hydralazine, lasix and spiro.  - With increased dyspnea with check ECHO. If EF down again will need up titrate bb.  Marland Kitchen He is not on Imdur with Viagra use.  - He is not on ACE-I d/t angioedema.  - Reinforced the need and importance of daily weights, a low sodium diet, and fluid restriction (less than 2 L a day). Instructed to call the HF clinic if weight increases more than 3 lbs overnight or 5 lbs in a week.  He may return to work.  2. CAD: H/O MI in [place]. S/P PCI/stenting of LCX with embolization to OM-2 in 9/14. Marland Kitchen  Completed on year of brillinta.  Continue statin, ASA and BB.   3. Current tobacco abuse- Needs to quit. Counseled to stop smoking.  4. HTN: Stable. Continue current medications.   Check BMET today. Repeat ECHO  IF EF normal can follow in 6 months. Also could refer back to Dr Katrinka Blazing.   CLEGG,AMY NP-C 8:36 AM

## 2014-09-14 NOTE — Patient Instructions (Signed)
Lab today  Your physician has requested that you have an echocardiogram. Echocardiography is a painless test that uses sound waves to create images of your heart. It provides your doctor with information about the size and shape of your heart and how well your heart's chambers and valves are working. This procedure takes approximately one hour. There are no restrictions for this procedure.  We will contact you in 6 months to schedule your next appointment.  

## 2014-09-16 ENCOUNTER — Encounter: Payer: Self-pay | Admitting: Internal Medicine

## 2014-09-16 ENCOUNTER — Ambulatory Visit (INDEPENDENT_AMBULATORY_CARE_PROVIDER_SITE_OTHER): Payer: Self-pay | Admitting: Internal Medicine

## 2014-09-16 VITALS — BP 125/68 | HR 74 | Temp 98.3°F | Ht 70.0 in | Wt 267.3 lb

## 2014-09-16 DIAGNOSIS — R918 Other nonspecific abnormal finding of lung field: Secondary | ICD-10-CM

## 2014-09-16 DIAGNOSIS — Z720 Tobacco use: Secondary | ICD-10-CM

## 2014-09-16 DIAGNOSIS — Z1211 Encounter for screening for malignant neoplasm of colon: Secondary | ICD-10-CM | POA: Insufficient documentation

## 2014-09-16 DIAGNOSIS — I10 Essential (primary) hypertension: Secondary | ICD-10-CM

## 2014-09-16 DIAGNOSIS — E119 Type 2 diabetes mellitus without complications: Secondary | ICD-10-CM

## 2014-09-16 LAB — POCT GLYCOSYLATED HEMOGLOBIN (HGB A1C): Hemoglobin A1C: 6.5

## 2014-09-16 LAB — GLUCOSE, CAPILLARY: Glucose-Capillary: 149 mg/dL — ABNORMAL HIGH (ref 65–99)

## 2014-09-16 MED ORDER — PSYLLIUM 28 % PO PACK: 1.0000 | PACK | Freq: Two times a day (BID) | ORAL | Status: AC | PRN

## 2014-09-16 NOTE — Assessment & Plan Note (Signed)
He is due for a follow-up scan but will wait for his Orange Card to be finalized before we order the study.

## 2014-09-16 NOTE — Assessment & Plan Note (Addendum)
He would like to get his colonoscopy though was deferred last year since he was taking Brillinta which he is now off. He is on Plavix, and I told him I would reach out to his cardiologist to see if we could stop this medication for this procedure, especially since he has some root canal procedures he would like to have. Referral will be ordered once his [employer] paperwork is finalized in the next several weeks.

## 2014-09-16 NOTE — Assessment & Plan Note (Addendum)
Lab Results  Component Value Date   HGBA1C 6.5 09/16/2014   HGBA1C 5.9 04/04/2014   HGBA1C 5.9 10/29/2013     Assessment: Diabetes control: good control (HgbA1C at goal) Progress toward A1C goal:  at goal Comments: Reports that he has increased appetite since February ever since he has lost his job and has not been eating healthy. He feels he could benefit from increased fiber in his diet. He feels he has been unable to exercise as much given the weather outside. He is currently taking metformin 500 mg twice daily and denies any symptoms of nausea, vomiting, abdominal pain, dizziness. He has brought his glucometer today which only has one reading of 117 on 07/29/14.  Plan: Medications:  metformin 500 mg twice daily Home glucose monitoring: Frequency: once a day Timing: before breakfast Instruction/counseling given: reminded to get eye exam Educational resources provided: brochure, handout Self management tools provided: copy of home glucose meter download Other plans:  -Prescribed psyllium and will reassess at follow-up -Repeat A1c is 6.5, increased from 5.9 back in February, and resulted after he left the visit. I will call and advise the patient to improve his diet and try to exercise around the weather if at all possible.

## 2014-09-16 NOTE — Assessment & Plan Note (Addendum)
  Assessment: Progress toward smoking cessation:  smoking the same amount Barriers to progress toward smoking cessation:  lack of motivation to quit Comments: Reports he has not made much progress since we last discussed his smoking back in February. He reports that he had quit cold [place] in the past and would do so again.  Plan: Instruction/counseling given:  I counseled patient on the dangers of tobacco use, advised patient to stop smoking, and reviewed strategies to maximize success. Educational resources provided:  [employer]) brochure Self management tools provided:    Medications to assist with smoking cessation:  None and Offered but declined Patient agreed to the following self-care plans for smoking cessation:    Other plans:  -I explained to the patient in depth the risk is ongoing smoking places with regards to his lung nodules, diabetes, cardiac comorbidities. He reports me that he acknowledges understanding and resolves to do better.

## 2014-09-16 NOTE — Assessment & Plan Note (Signed)
BP Readings from Last 3 Encounters:  09/16/14 125/68  09/14/14 120/80  04/04/14 139/73    Lab Results  Component Value Date   NA 138 09/14/2014   K 4.0 09/14/2014   CREATININE 1.36* 09/14/2014    Assessment: Blood pressure control:   Progress toward BP goal:    Comments: Stable on current regimen. Of note, he is taking Imdur 30 mg daily and has not taken Viagra for several months now.  Plan: Medications:  Carvedilol 12.5 mg twice daily, Lasix 40 mg as needed, hydralazine 25 mg 3 times daily, Imdur 30 mg Educational resources provided: brochure, handout, video Self management tools provided:   Other plans:  -Continue current regimen

## 2014-09-16 NOTE — Progress Notes (Signed)
   Subjective:    Patient ID: Devon [name], male    DOB: 1961/01/01, 54 y.o.   MRN: [id number]  HPI Mr. Porte is a 54 year old male with chronic diastolic heart failure, coronary arteries disease status post PCI 2 and angioplasty, hypertension, hyperlipidemia, tobacco abuse complicated by pulmonary nodules who presents today for follow-up visit. Please see assessment & plan for documentation of each problem.  Of note, he lost his job back in February and has been without insurance. He is currently finalizing his paperwork to renew his [employer].    Review of Systems  Respiratory: Positive for shortness of breath (Only with activity in the heat).   Cardiovascular: Negative for chest pain and leg swelling.  Gastrointestinal: Negative for nausea, vomiting, abdominal pain, diarrhea and blood in stool.  Neurological: Negative for dizziness and headaches.       Objective:   Physical Exam Constitutional: African American male. No distress.  HENT:  Head: Normocephalic and atraumatic.  Eyes: Conjunctivae are normal. Pupils are 4 mm, direct, consensual, near.  Cardiovascular: Normal rate, regular rhythm and normal heart sounds.  Exam reveals no gallop and no friction rub. No murmur heard. Pulmonary/Chest: Effort normal. No respiratory distress. He has no wheezes. He has no rales.  Abdominal: Soft. Bowel sounds are normal. He exhibits no distension. There is no tenderness.  Neurological: He is alert and oriented to person, place, and time. Coordination normal.  Skin: Skin is warm and dry. He is not diaphoretic.  Psychiatric: His behavior is normal.          Assessment & Plan:

## 2014-09-19 NOTE — Progress Notes (Signed)
[facility] Attending  Case discussed with Dr. Patel,Rushil at the time of the visit.  We reviewed the resident's history and exam and pertinent patient test results.  I agree with the assessment, diagnosis, and plan of care documented in the resident's note.  

## 2014-09-20 ENCOUNTER — Ambulatory Visit (HOSPITAL_COMMUNITY): Payer: Self-pay | Attending: Cardiovascular Disease

## 2014-09-20 ENCOUNTER — Other Ambulatory Visit: Payer: Self-pay

## 2014-09-20 DIAGNOSIS — Z6838 Body mass index (BMI) 38.0-38.9, adult: Secondary | ICD-10-CM | POA: Insufficient documentation

## 2014-09-20 DIAGNOSIS — I5032 Chronic diastolic (congestive) heart failure: Secondary | ICD-10-CM

## 2014-09-20 DIAGNOSIS — E119 Type 2 diabetes mellitus without complications: Secondary | ICD-10-CM | POA: Insufficient documentation

## 2014-09-20 DIAGNOSIS — I517 Cardiomegaly: Secondary | ICD-10-CM | POA: Insufficient documentation

## 2014-09-20 DIAGNOSIS — I10 Essential (primary) hypertension: Secondary | ICD-10-CM | POA: Insufficient documentation

## 2014-09-20 DIAGNOSIS — F17200 Nicotine dependence, unspecified, uncomplicated: Secondary | ICD-10-CM | POA: Insufficient documentation

## 2014-09-20 DIAGNOSIS — E669 Obesity, unspecified: Secondary | ICD-10-CM | POA: Insufficient documentation

## 2014-09-20 DIAGNOSIS — E785 Hyperlipidemia, unspecified: Secondary | ICD-10-CM | POA: Insufficient documentation

## 2014-09-21 ENCOUNTER — Encounter: Payer: Self-pay | Admitting: Internal Medicine

## 2014-09-21 NOTE — Progress Notes (Signed)
Patient ID: Devon [name], male   DOB: 09-16-1960, 54 y.o.   MRN: [id number]   Reason for call:   I placed an outgoing call to [name] at 6:05  PM regarding his recent labwork last week.   Assessment/ Plan:   I explained to him his A1c had increased from 5.9 to 6.5.   Given this change, I explained to him it's important he continues to watch his diet and exercise. He reported to me he is still working on affording his medications.   I advised him to let our office know once his [employer] Card is sorted out to facilitate referrals and additional studies.  As always, pt is advised that if symptoms worsen or new symptoms arise, they should go to an urgent care facility or to to ER for further evaluation.   Beather Arbour, MD   09/21/2014, 6:08 PM

## 2014-09-22 NOTE — Progress Notes (Signed)
Okay to stop Plavix

## 2014-09-23 NOTE — Addendum Note (Signed)
Addended by: Mliss Fritz on: 09/23/2014 06:34 PM   Modules accepted: Orders, Medications

## 2014-09-23 NOTE — Progress Notes (Signed)
Contacted patient and explained plavix discontinuation. Patient verbalized understanding. I also offered assistance with other medications but patient declined at this time. Patient was advised to contact me in the future if medication-related concerns arise.

## 2014-09-27 IMAGING — CT CT ANGIO CHEST
2 of 8 series · 17 of 46 positions shown · IV contrast (APPLIED)
Comparison: Chest radiograph October 28, 2012

CLINICAL DATA: Shortness of Breath

EXAM:
CT ANGIOGRAPHY CHEST WITH CONTRAST
TECHNIQUE: Multidetector CT imaging of the chest was performed using the
standard protocol during bolus administration of intravenous
contrast. Multiplanar CT image reconstructions including MIPs were
obtained to evaluate the vascular anatomy.
CONTRAST:  100mL OMNIPAQUE IOHEXOL 350 MG/ML SOLN

[Series 5: thins · axial · 0.79mm/px · z∈[-268,-10]mm · 14 of 284 slices shown]
[im 13/284  lung]
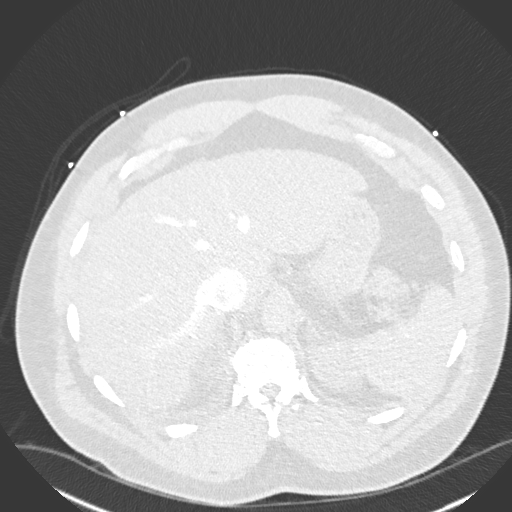
[im 39/284  soft-tissue]
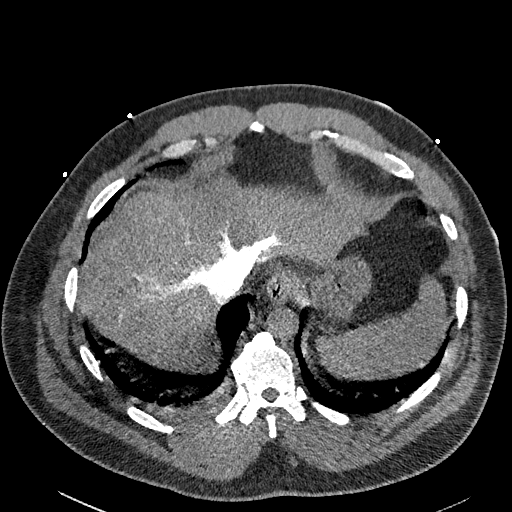
[im 52/284  lung]
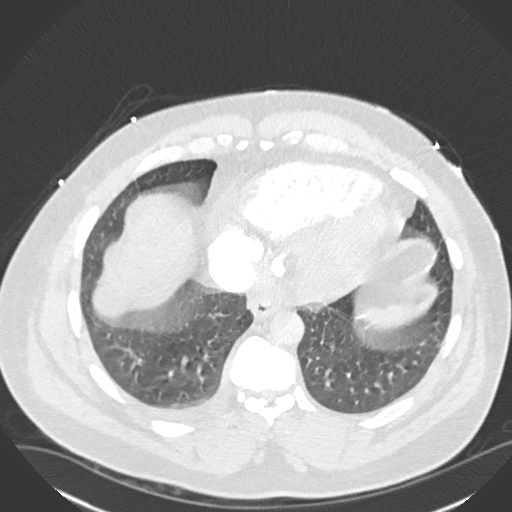
[im 78/284  soft-tissue]
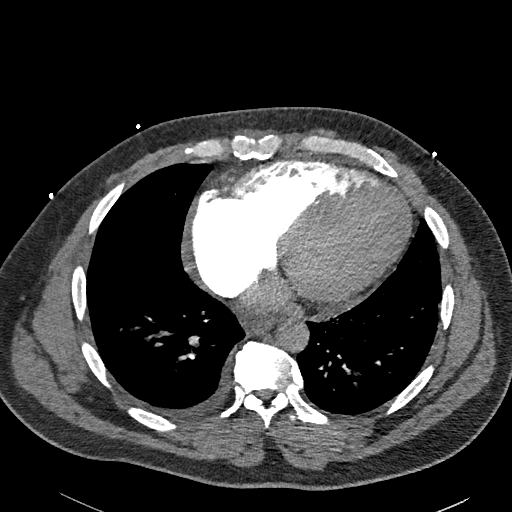
[im 91/284  lung]
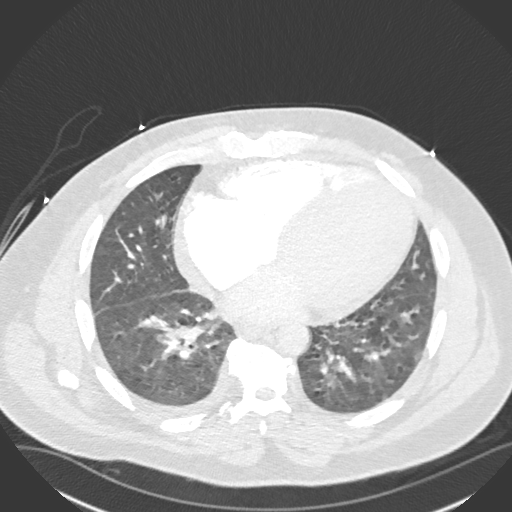
[im 116/284  soft-tissue]
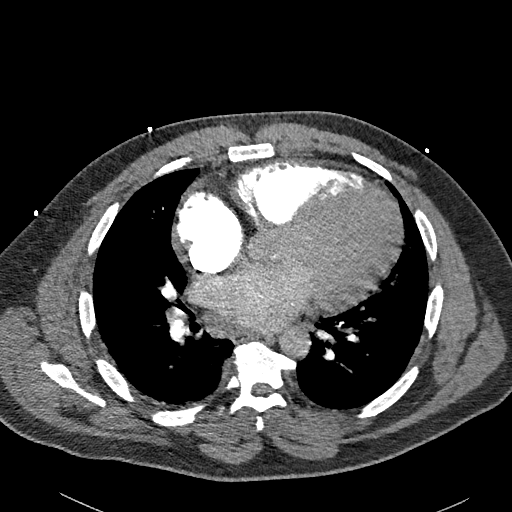
[im 129/284  lung]
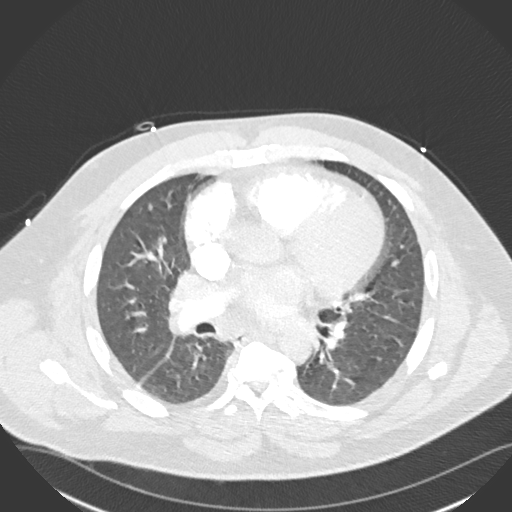
[im 155/284  soft-tissue]
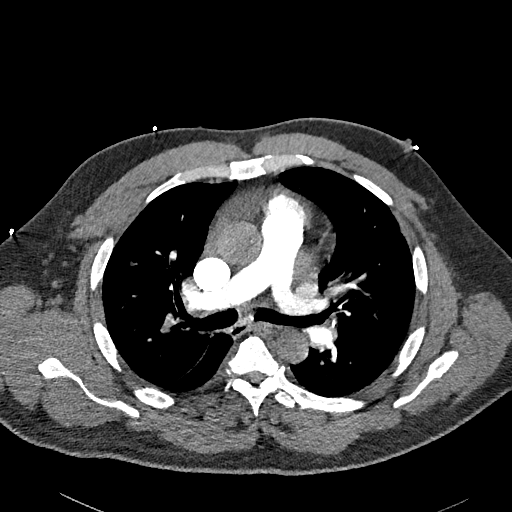
[im 168/284  lung]
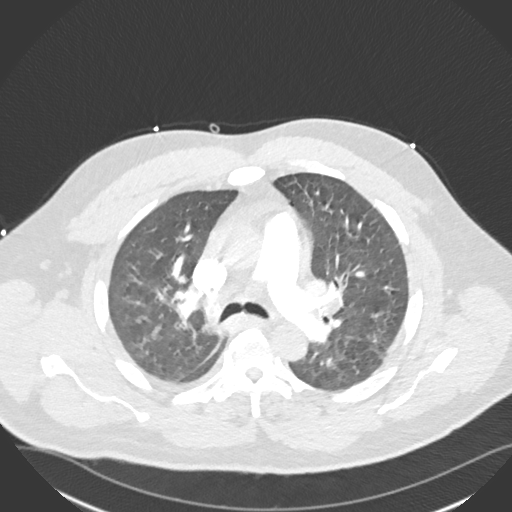
[im 193/284  soft-tissue]
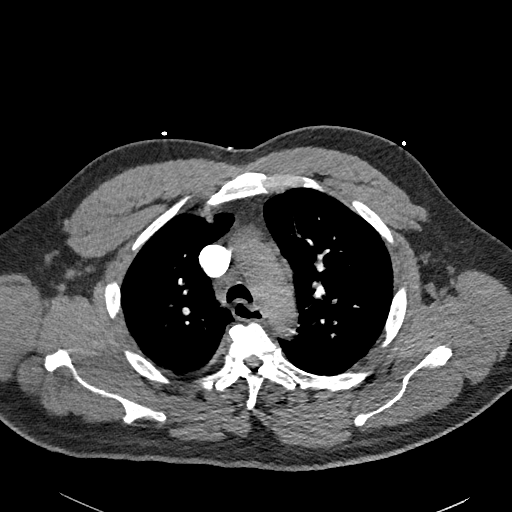
[im 206/284  lung]
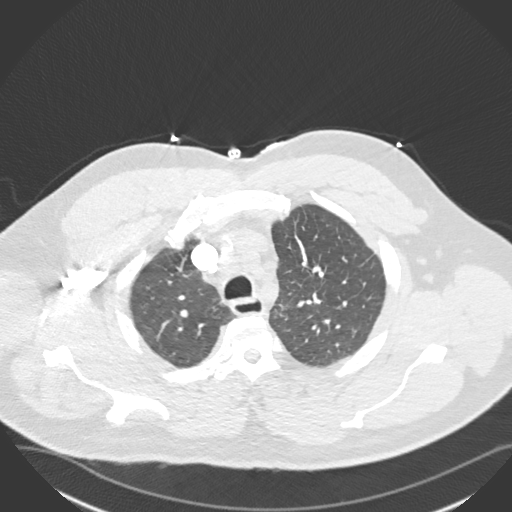
[im 232/284  soft-tissue]
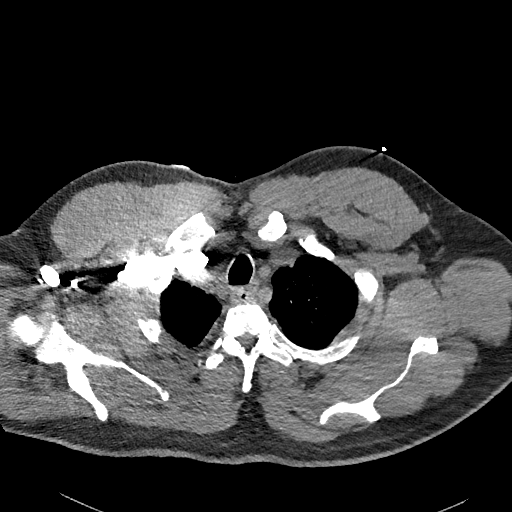
[im 245/284  lung]
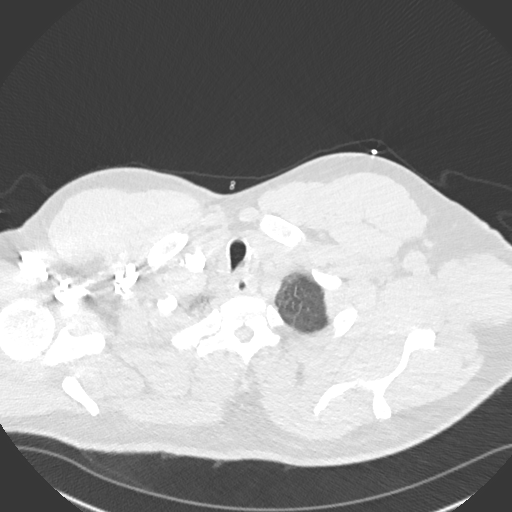
[im 271/284  soft-tissue]
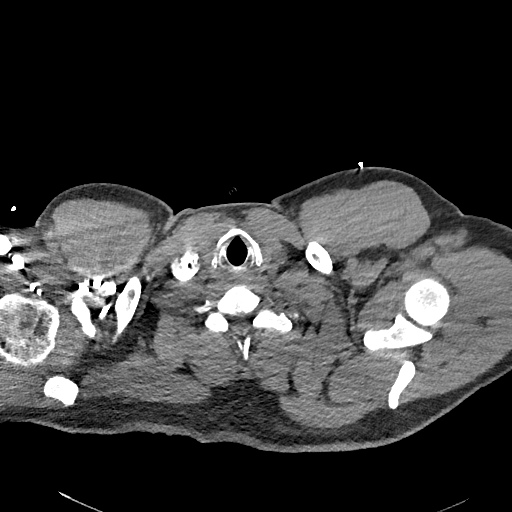

[Series 7: coronal mpr · coronal · 0.60mm/px · 3 of 121 slices shown]
[im 31/121  soft-tissue]
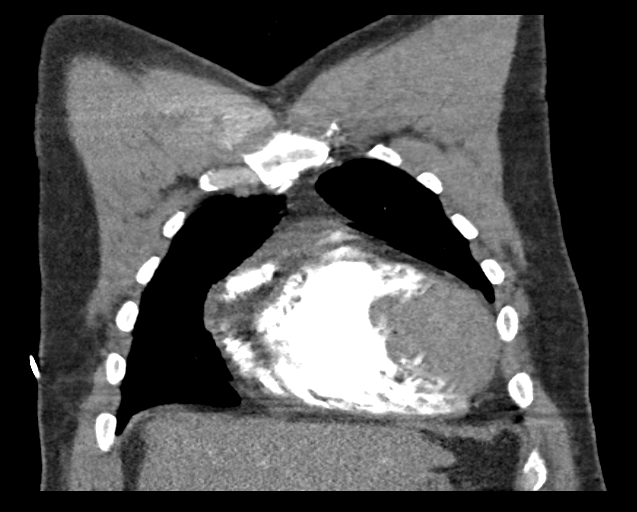
[im 61/121  soft-tissue]
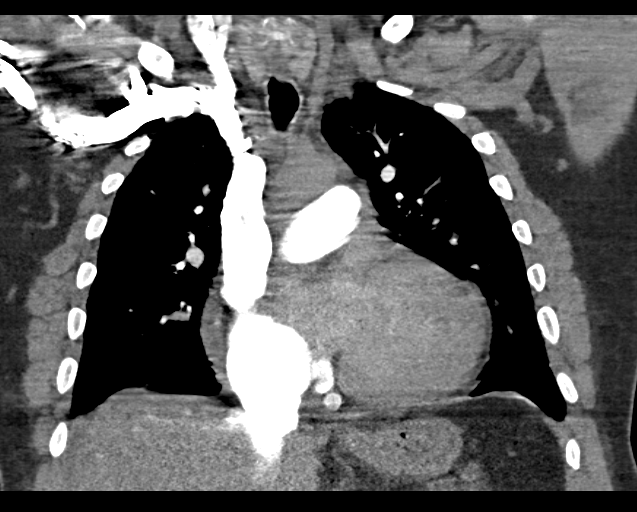
[im 91/121  soft-tissue]
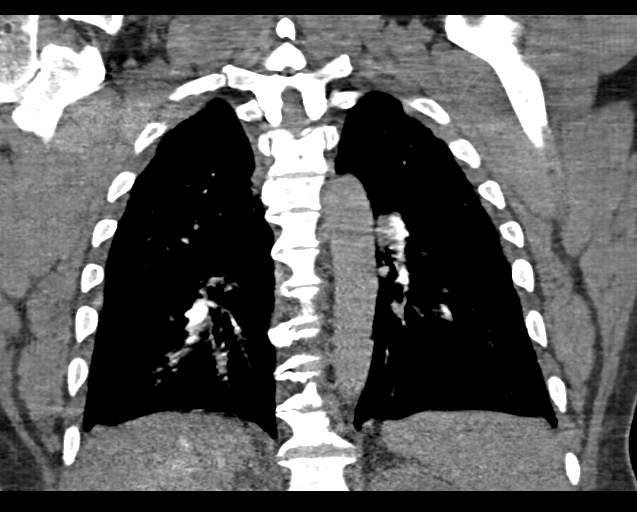

[17 of 46 positions shown; findings below may reference images not displayed]

FINDINGS: There is no demonstrable pulmonary embolus. There is no thoracic
aortic aneurysm.

Prominence of the hepatic veins and inferior vena cava raise
question of a degree of right heart failure.

On axial slice 63, there is an 8 by 7 mm nodular opacity abutting
the pleura in the lateral segment of the left lower lobe. On this
same slice, there is a 5 x 5 mm nodular opacity in the periphery of
the posterior segment of the right lower lobe. The interstitium is
diffusely prominent with a suspected mild degree of interstitial
edema. There is no airspace consolidation. There is a small right
pleural effusion.

There are several lymph nodes adjacent to the aortic arch and
proximal descending thoracic aorta extending to the aortopulmonary
window. The largest of these lymph nodes measures 2.2 by 1.6 cm.
There is a lymph node just anterior to the Carina on the right
measuring 2.0 by 1.5 cm. Several smaller lymph nodes are noted in
the hila and mediastinum. There is a right hilar lymph node
measuring 1.5 x 1.4 cm.

Heart is mildly enlarged. There is a minimal pericardial effusion.

The visualized upper abdominal structures appear normal except for
prominence of the visualized inferior vena cava and hepatic veins.

There are no blastic or lytic bone lesions. Thyroid appears normal.

Review of the MIP images confirms the above findings.
IMPRESSION: No demonstrable pulmonary embolus.

Suspect a degree of congestive heart failure. There is felt to be a
degree of right sided heart failure as at least part of the
congestive heart failure pattern.

Two nodular opacities in the left lower lobe. Followup of these
nodular lesion should be based on [HOSPITAL] guidelines.

Mild adenopathy of uncertain etiology.

Minimal pericardial effusion.

If the patient is at high risk for bronchogenic carcinoma, follow-up
chest CT at 3-8months is recommended. If the patient is at low risk
for bronchogenic carcinoma, follow-up chest CT at 6-12 months is
recommended. This recommendation follows the consensus statement:
Guidelines for Management of Small Pulmonary Nodules Detected on CT
Scans: A Statement from the [HOSPITAL] as published in

## 2014-10-07 ENCOUNTER — Telehealth: Payer: Self-pay | Admitting: Internal Medicine

## 2014-10-07 DIAGNOSIS — R918 Other nonspecific abnormal finding of lung field: Secondary | ICD-10-CM

## 2014-10-07 DIAGNOSIS — Z1211 Encounter for screening for malignant neoplasm of colon: Secondary | ICD-10-CM

## 2014-10-07 NOTE — Telephone Encounter (Signed)
I spoke with the patient, and he just wanted let me know that his orange card was approved. He remembered from our last visit that I would not put in any referrals or additional testing until his orange card was authorized. I will go ahead and order his screening lung CT scan as well as a referral to GI for colonoscopy. He also has a pending eye doctor referral.

## 2014-10-07 NOTE — Telephone Encounter (Signed)
Patient requesting his dr call him. Would not tell what it was about.

## 2014-10-10 ENCOUNTER — Encounter (HOSPITAL_COMMUNITY): Payer: Self-pay

## 2014-10-10 ENCOUNTER — Telehealth: Payer: Self-pay | Admitting: *Deleted

## 2014-10-10 NOTE — Telephone Encounter (Signed)
Pt aware of Ct at [facility] 10/13/14 8:30AM - NPO 4 hours before CT. Stanton Kidney Kalup Jaquith RN 10/10/14 12N

## 2014-10-10 NOTE — Progress Notes (Signed)
[facility] sent med rec request for all progress notes/office visits, lab results, diagnostic test results, imaging, cath reports, admission and discharge reports, H&Ps, EKGs. All records available from our office/providers faxed to [phone]. Copy of request scanned into electronic medical records.  Ave Filter

## 2014-10-12 ENCOUNTER — Other Ambulatory Visit: Payer: Self-pay | Admitting: Internal Medicine

## 2014-10-12 DIAGNOSIS — Z1211 Encounter for screening for malignant neoplasm of colon: Secondary | ICD-10-CM

## 2014-10-12 NOTE — Addendum Note (Signed)
Addended by: Heywood Iles V on: 10/12/2014 09:00 AM   Modules accepted: Orders

## 2014-10-13 ENCOUNTER — Ambulatory Visit (HOSPITAL_COMMUNITY)
Admission: RE | Admit: 2014-10-13 | Discharge: 2014-10-13 | Disposition: A | Discharge status: Home / Self Care | Payer: Self-pay | Source: Ambulatory Visit | Attending: Internal Medicine | Admitting: Internal Medicine

## 2014-10-13 DIAGNOSIS — Z955 Presence of coronary angioplasty implant and graft: Secondary | ICD-10-CM | POA: Insufficient documentation

## 2014-10-13 DIAGNOSIS — R918 Other nonspecific abnormal finding of lung field: Secondary | ICD-10-CM | POA: Insufficient documentation

## 2014-10-13 DIAGNOSIS — I2510 Atherosclerotic heart disease of native coronary artery without angina pectoris: Secondary | ICD-10-CM | POA: Insufficient documentation

## 2014-10-14 NOTE — Addendum Note (Signed)
Addended by: Neomia Dear on: 10/14/2014 07:23 AM   Modules accepted: Orders

## 2014-10-19 ENCOUNTER — Other Ambulatory Visit: Payer: Self-pay | Admitting: Internal Medicine

## 2014-10-19 DIAGNOSIS — Z1211 Encounter for screening for malignant neoplasm of colon: Secondary | ICD-10-CM

## 2014-10-27 ENCOUNTER — Encounter: Payer: Self-pay | Admitting: Internal Medicine

## 2014-11-24 ENCOUNTER — Other Ambulatory Visit (HOSPITAL_COMMUNITY): Payer: Self-pay | Admitting: Adult Health

## 2014-11-24 ENCOUNTER — Other Ambulatory Visit (HOSPITAL_COMMUNITY): Payer: Self-pay | Admitting: *Deleted

## 2014-11-24 MED ORDER — CARVEDILOL 12.5 MG PO TABS: 12.5000 mg | ORAL_TABLET | Freq: Two times a day (BID) | ORAL | Status: AC

## 2014-11-24 MED ORDER — ISOSORBIDE MONONITRATE ER 30 MG PO TB24: 30.0000 mg | ORAL_TABLET | Freq: Every day | ORAL | Status: AC

## 2014-11-24 MED ORDER — FUROSEMIDE 40 MG PO TABS: 40.0000 mg | ORAL_TABLET | Freq: Every day | ORAL | Status: AC

## 2014-11-24 MED ORDER — SPIRONOLACTONE 25 MG PO TABS: 25.0000 mg | ORAL_TABLET | Freq: Every day | ORAL | Status: AC

## 2014-11-24 MED ORDER — HYDRALAZINE HCL 25 MG PO TABS: 25.0000 mg | ORAL_TABLET | Freq: Three times a day (TID) | ORAL | Status: AC

## 2014-11-24 MED ORDER — ATORVASTATIN CALCIUM 20 MG PO TABS: 20.0000 mg | ORAL_TABLET | Freq: Every day | ORAL | Status: AC

## 2014-11-30 DIAGNOSIS — Z736 Limitation of activities due to disability: Secondary | ICD-10-CM

## 2015-01-09 ENCOUNTER — Encounter: Payer: Self-pay | Admitting: Internal Medicine

## 2015-01-25 ENCOUNTER — Encounter: Payer: Self-pay | Admitting: *Deleted

## 2015-01-26 ENCOUNTER — Ambulatory Visit: Payer: Self-pay | Admitting: Internal Medicine

## 2015-01-26 ENCOUNTER — Encounter: Payer: Self-pay | Admitting: Internal Medicine

## 2015-01-27 ENCOUNTER — Ambulatory Visit: Payer: Self-pay | Admitting: Interventional Cardiology

## 2015-02-28 NOTE — Addendum Note (Signed)
Addended by: Neomia Dear on: 02/28/2015 05:12 PM   Modules accepted: Orders

## 2015-03-29 ENCOUNTER — Telehealth: Payer: Self-pay | Admitting: Internal Medicine

## 2015-03-29 NOTE — Telephone Encounter (Signed)
CALLED PT TIME TO RENEW GCCN CARD, PHONE HAS BEEN DISCONNECTED

## 2015-06-22 ENCOUNTER — Encounter: Payer: Self-pay | Admitting: Internal Medicine

## 2015-06-22 ENCOUNTER — Telehealth: Payer: Self-pay | Admitting: Internal Medicine

## 2015-06-22 NOTE — Telephone Encounter (Signed)
Tried to call patient this am to schedule an appt, but phone just keeps ringing then hangs up.  A voicemail did not come on.  Sending patient a letter asking to pls call the clinic asap.

## 2015-07-11 ENCOUNTER — Telehealth: Payer: Self-pay | Admitting: Internal Medicine

## 2015-07-11 NOTE — Telephone Encounter (Signed)
Interesting. It looks like he has not followed up with Cardiology since August 2016 either.

## 2015-07-11 NOTE — Telephone Encounter (Signed)
Letter that was mailed to patient was returned stated attempted not know and return to sender.  Tried calling patient again today went straight to voicemail and unable to leave msg, because voicemail is not set up.

## 2016-01-19 ENCOUNTER — Encounter (HOSPITAL_COMMUNITY): Payer: Self-pay

## 2016-01-19 NOTE — Progress Notes (Signed)
[facility] fax dated 12/27/2015 requesting records 03/14/2013--present. All available records from [facility] clinic/providers faxed to proveded # [phone] (19 pages total). Copy of request scanned in to patient's electronic medical record. Case # [id number]  Ave FilterBradley, Megan Genevea, RN
